# Patient Record
Sex: Female | Born: 1985 | Race: White | Hispanic: No | Smoking: Former smoker
Health system: Southern US, Community
[De-identification: ages and names within clinical notes are randomized; demographics above are authoritative.]

## PROBLEM LIST (undated history)

## (undated) DIAGNOSIS — E785 Hyperlipidemia, unspecified: Secondary | ICD-10-CM

## (undated) HISTORY — DX: Hyperlipidemia, unspecified: E78.5

---

## 2004-09-29 ENCOUNTER — Emergency Department (HOSPITAL_COMMUNITY): Admission: EM | Admit: 2004-09-29 | Discharge: 2004-09-29 | Payer: Self-pay | Admitting: Emergency Medicine

## 2004-12-24 ENCOUNTER — Emergency Department (HOSPITAL_COMMUNITY): Admission: EM | Admit: 2004-12-24 | Discharge: 2004-12-24 | Payer: Self-pay | Admitting: *Deleted

## 2005-01-05 ENCOUNTER — Ambulatory Visit (HOSPITAL_COMMUNITY): Admission: RE | Admit: 2005-01-05 | Discharge: 2005-01-05 | Payer: Self-pay | Admitting: *Deleted

## 2005-05-23 ENCOUNTER — Ambulatory Visit (HOSPITAL_COMMUNITY): Admission: RE | Admit: 2005-05-23 | Discharge: 2005-05-23 | Payer: Self-pay | Admitting: *Deleted

## 2005-06-05 ENCOUNTER — Ambulatory Visit (HOSPITAL_COMMUNITY): Admission: AD | Admit: 2005-06-05 | Discharge: 2005-06-05 | Payer: Self-pay | Admitting: *Deleted

## 2005-06-07 ENCOUNTER — Inpatient Hospital Stay (HOSPITAL_COMMUNITY): Admission: AD | Admit: 2005-06-07 | Discharge: 2005-06-09 | Payer: Self-pay | Admitting: *Deleted

## 2006-01-27 ENCOUNTER — Emergency Department (HOSPITAL_COMMUNITY): Admission: EM | Admit: 2006-01-27 | Discharge: 2006-01-27 | Payer: Self-pay | Admitting: Emergency Medicine

## 2006-01-28 ENCOUNTER — Emergency Department (HOSPITAL_COMMUNITY): Admission: EM | Admit: 2006-01-28 | Discharge: 2006-01-28 | Payer: Self-pay | Admitting: Emergency Medicine

## 2006-05-30 ENCOUNTER — Emergency Department (HOSPITAL_COMMUNITY): Admission: EM | Admit: 2006-05-30 | Discharge: 2006-05-30 | Payer: Self-pay | Admitting: Emergency Medicine

## 2006-12-09 ENCOUNTER — Emergency Department (HOSPITAL_COMMUNITY): Admission: EM | Admit: 2006-12-09 | Discharge: 2006-12-09 | Payer: Self-pay | Admitting: Family Medicine

## 2007-04-15 ENCOUNTER — Encounter: Admission: RE | Admit: 2007-04-15 | Discharge: 2007-04-15 | Payer: Self-pay | Admitting: *Deleted

## 2007-07-05 ENCOUNTER — Ambulatory Visit (HOSPITAL_COMMUNITY): Admission: RE | Admit: 2007-07-05 | Discharge: 2007-07-05 | Payer: Self-pay | Admitting: Obstetrics and Gynecology

## 2007-07-30 ENCOUNTER — Ambulatory Visit (HOSPITAL_COMMUNITY): Admission: RE | Admit: 2007-07-30 | Discharge: 2007-07-30 | Payer: Self-pay | Admitting: Obstetrics and Gynecology

## 2007-08-22 ENCOUNTER — Encounter: Admission: RE | Admit: 2007-08-22 | Discharge: 2007-08-29 | Payer: Self-pay | Admitting: *Deleted

## 2007-10-29 ENCOUNTER — Inpatient Hospital Stay (HOSPITAL_COMMUNITY): Admission: AD | Admit: 2007-10-29 | Discharge: 2007-10-29 | Payer: Self-pay | Admitting: Gynecology

## 2007-11-01 ENCOUNTER — Inpatient Hospital Stay (HOSPITAL_COMMUNITY): Admission: AD | Admit: 2007-11-01 | Discharge: 2007-11-01 | Payer: Self-pay | Admitting: Obstetrics and Gynecology

## 2007-12-25 ENCOUNTER — Inpatient Hospital Stay (HOSPITAL_COMMUNITY): Admission: AD | Admit: 2007-12-25 | Discharge: 2007-12-27 | Payer: Self-pay | Admitting: Obstetrics and Gynecology

## 2008-08-11 ENCOUNTER — Emergency Department (HOSPITAL_COMMUNITY): Admission: EM | Admit: 2008-08-11 | Discharge: 2008-08-11 | Payer: Self-pay | Admitting: Emergency Medicine

## 2008-08-18 ENCOUNTER — Inpatient Hospital Stay (HOSPITAL_COMMUNITY): Admission: EM | Admit: 2008-08-18 | Discharge: 2008-08-21 | Payer: Self-pay | Admitting: Emergency Medicine

## 2008-12-25 ENCOUNTER — Emergency Department (HOSPITAL_COMMUNITY): Admission: EM | Admit: 2008-12-25 | Discharge: 2008-12-25 | Payer: Self-pay | Admitting: Emergency Medicine

## 2008-12-29 ENCOUNTER — Emergency Department (HOSPITAL_COMMUNITY): Admission: EM | Admit: 2008-12-29 | Discharge: 2008-12-29 | Payer: Self-pay | Admitting: Emergency Medicine

## 2009-02-16 ENCOUNTER — Emergency Department (HOSPITAL_COMMUNITY): Admission: EM | Admit: 2009-02-16 | Discharge: 2009-02-16 | Payer: Self-pay | Admitting: Emergency Medicine

## 2009-02-23 ENCOUNTER — Emergency Department (HOSPITAL_COMMUNITY): Admission: EM | Admit: 2009-02-23 | Discharge: 2009-02-23 | Payer: Self-pay | Admitting: Emergency Medicine

## 2009-03-08 ENCOUNTER — Emergency Department (HOSPITAL_COMMUNITY): Admission: EM | Admit: 2009-03-08 | Discharge: 2009-03-08 | Payer: Self-pay | Admitting: Emergency Medicine

## 2009-03-10 ENCOUNTER — Emergency Department (HOSPITAL_COMMUNITY): Admission: EM | Admit: 2009-03-10 | Discharge: 2009-03-10 | Payer: Self-pay | Admitting: Emergency Medicine

## 2009-06-02 ENCOUNTER — Emergency Department (HOSPITAL_COMMUNITY): Admission: EM | Admit: 2009-06-02 | Discharge: 2009-06-02 | Payer: Self-pay | Admitting: Emergency Medicine

## 2010-12-25 ENCOUNTER — Encounter: Payer: Self-pay | Admitting: *Deleted

## 2011-03-20 LAB — HEPATIC FUNCTION PANEL
ALT: 143 U/L — ABNORMAL HIGH (ref 0–35)
AST: 89 U/L — ABNORMAL HIGH (ref 0–37)
Albumin: 3.7 g/dL (ref 3.5–5.2)
Alkaline Phosphatase: 157 U/L — ABNORMAL HIGH (ref 39–117)
Bilirubin, Direct: 0.2 mg/dL (ref 0.0–0.3)
Indirect Bilirubin: 0.1 mg/dL — ABNORMAL LOW (ref 0.3–0.9)
Total Bilirubin: 0.3 mg/dL (ref 0.3–1.2)
Total Protein: 7.1 g/dL (ref 6.0–8.3)

## 2011-03-20 LAB — DIFFERENTIAL
Basophils Absolute: 0 10*3/uL (ref 0.0–0.1)
Basophils Relative: 1 % (ref 0–1)
Eosinophils Absolute: 0.1 10*3/uL (ref 0.0–0.7)
Eosinophils Relative: 3 % (ref 0–5)
Lymphocytes Relative: 39 % (ref 12–46)
Lymphs Abs: 1.6 10*3/uL (ref 0.7–4.0)
Monocytes Absolute: 0.5 10*3/uL (ref 0.1–1.0)
Monocytes Relative: 12 % (ref 3–12)
Neutro Abs: 1.9 10*3/uL (ref 1.7–7.7)
Neutrophils Relative %: 45 % (ref 43–77)

## 2011-03-20 LAB — CBC
HCT: 43 % (ref 36.0–46.0)
Hemoglobin: 14.3 g/dL (ref 12.0–15.0)
MCHC: 33.2 g/dL (ref 30.0–36.0)
MCV: 85 fL (ref 78.0–100.0)
Platelets: 287 10*3/uL (ref 150–400)
RBC: 5.06 MIL/uL (ref 3.87–5.11)
RDW: 13.1 % (ref 11.5–15.5)
WBC: 4.2 10*3/uL (ref 4.0–10.5)

## 2011-03-20 LAB — URINALYSIS, ROUTINE W REFLEX MICROSCOPIC
Bilirubin Urine: NEGATIVE
Bilirubin Urine: NEGATIVE
Glucose, UA: NEGATIVE mg/dL
Glucose, UA: NEGATIVE mg/dL
Hgb urine dipstick: NEGATIVE
Ketones, ur: NEGATIVE mg/dL
Ketones, ur: 15 mg/dL — AB
Nitrite: NEGATIVE
Nitrite: NEGATIVE
Protein, ur: NEGATIVE mg/dL
Specific Gravity, Urine: 1.015 (ref 1.005–1.030)
Specific Gravity, Urine: 1.028 (ref 1.005–1.030)
Urobilinogen, UA: 0.2 mg/dL (ref 0.0–1.0)
Urobilinogen, UA: 1 mg/dL (ref 0.0–1.0)
pH: 8.5 — ABNORMAL HIGH (ref 5.0–8.0)
pH: 6.5 (ref 5.0–8.0)

## 2011-03-20 LAB — BASIC METABOLIC PANEL
BUN: 10 mg/dL (ref 6–23)
CO2: 25 mEq/L (ref 19–32)
Calcium: 9.4 mg/dL (ref 8.4–10.5)
Chloride: 107 mEq/L (ref 96–112)
Creatinine, Ser: 0.72 mg/dL (ref 0.4–1.2)
GFR calc Af Amer: >60 mL/min (ref >60)
GFR calc non Af Amer: >60 mL/min (ref >60)
Glucose, Bld: 99 mg/dL (ref 70–99)
Potassium: 3.9 mEq/L (ref 3.5–5.1)
Sodium: 140 mEq/L (ref 135–145)

## 2011-03-20 LAB — LIPASE, BLOOD: Lipase: 41 U/L (ref 11–59)

## 2011-03-20 LAB — GC/CHLAMYDIA PROBE AMP, GENITAL
Chlamydia, DNA Probe: NEGATIVE
GC Probe Amp, Genital: NEGATIVE

## 2011-03-20 LAB — WET PREP, GENITAL
Clue Cells Wet Prep HPF POC: NONE SEEN
Trich, Wet Prep: NONE SEEN
WBC, Wet Prep HPF POC: NONE SEEN
Yeast Wet Prep HPF POC: NONE SEEN

## 2011-03-20 LAB — URINE MICROSCOPIC-ADD ON

## 2011-03-20 LAB — POCT PREGNANCY, URINE: Preg Test, Ur: NEGATIVE

## 2011-03-20 LAB — PREGNANCY, URINE: Preg Test, Ur: NEGATIVE

## 2011-04-18 NOTE — Discharge Summary (Signed)
NAMESHELVA, [name]                ACCOUNT NO.:  [id number]   MEDICAL RECORD NO.:  [id number]          PATIENT TYPE:  INP   LOCATION:  5120                         FACILITY:  [facility]   PHYSICIAN:  Re[name], M.D.   DATE OF BIRTH:  1986-10-26   DATE OF ADMISSION:  08/17/2008  DATE OF DISCHARGE:  08/21/2008                               DISCHARGE SUMMARY   PRIMARY CARE [name]:  Rebekah Feeler, PA, [facility], [name] at  [name].   DISCHARGE DIAGNOSES:  1. Escherichia coli pyelonephritis.  2. Bacterial vaginosis.  3. Mild right hydroureter in the setting of a history of      nephrolithiasis.  4. Hyperbilirubinemia, resolved.  5. Normocytic anemia.  6. Mild thrombocytopenia.  7. Moderate protein-calorie malnutrition.  8. Chest pain.   DISCHARGE MEDICATIONS:  1. Cipro 500 mg b.i.d. x11 days.  2. Ibuprofen 600 mg q.6 h. p.r.n. pain.   CONSULTATIONS:  None.   BRIEF ADMISSION HISTORY OF PRESENT ILLNESS:  The patient is a 25-year-  old female who presented to the [name] with chief complaint of nausea,  vomiting, and back pain.  She apparently was seen in [name] in the  emergency department there several days prior to this and was discharged  from the emergency department, but continued to have symptoms and  represented to the [name] on August 17, 2008.  At that time,  she was found to have abnormal urinalysis and was admitted for further  evaluation and treatment.  For the full details, please see the dictated  report done by Dr. Oralia [name].   PROCEDURES AND DIAGNOSTIC STUDIES:  1. CT scan of the abdomen and pelvis on August 18, 2008, showed      mild right-sided hydroureter with no ureteral or bladder stones      identified.  The colon and small bowel were unremarkable.  There      was no free fluid or abdominal fluid collections.  There was no      significant lymphadenopathy.  The bladder was normal.  Visualized      lung bases were  clear.  Kidneys were normal with no hydronephrosis      or nephrolithiasis.  2. KUB on August 19, 2008, showed normal bowel gas pattern.  3. CT angiogram of the chest done August 20, 2008, showed bilateral      small pleural effusions with associated compressive atelectasis and      no evidence for pulmonary embolism up to and including the third      order pulmonary arteries.   DISCHARGE LABORATORY VALUES:  Chemistries were completely normal with  the exception of a total protein of 4.7, albumin of 2.2, and calcium of  7.8.  CBC was normal with the exception of a hemoglobin of 9.6,  hematocrit of 28.2, platelet count of 141, and white blood cell count  was 6.4.   [name] COURSE BY PROBLEM:  1. Escherichia coli pyelonephritis with abdominal pain, nausea, and      vomiting as well as back pain:  The patient was admitted and  empirically put on IV Cipro.  On [name] day #1, this was      broadened to include IV Rocephin pending sensitivity data.  Blood      and urine cultures were obtained and urine culture subsequently      grew out Escherichia coli that was sensitive to Cipro.  The patient      was put back on Cipro by the oral route.  Fever curve improved with      treatment.  She never developed any significant leukocytosis and at      this time, the patient is reporting that her back pain has resolved      and she no longer has any nausea or vomiting.  She will complete an      additional 10 days of therapy with Cipro for full 2-week course of      treatment.  2. Bacterial vaginosis:  Course of her workup, the patient did receive      a wet prep and vaginal exam in the emergency department.  Wet prep      showed many white blood cells with a few clue cells.  There were no      trichomonads and moderate yeast.  At this time, the patient is not      complaining of vaginal symptoms and therefore is not treated.  GC      and chlamydia probes were also checked and found to  be negative.      If she develops signs of vaginitis or irritation, empiric treatment      with Flagyl would be recommended.  Additionally, if she develops      worsening abdominal pain or nonresolution of symptoms, a workup for      pelvic inflammatory disease can be considered.  3. Mild right hydroureter/history of nephrolithiasis:  Is likely the      patient did pass a stone and has some residual right hydroureter      and pain in the right lower quadrant.  The patient has been      counseled that this should improve over time, but to return to the      emergency department for a fever or nonresolution of symptoms.  4. Hyperbilirubinemia:  The patient's bilirubin was elevated on      admission, but quickly reverted to normal and is normal at      discharge.  5. Moderate protein depletion malnutrition:  Suspect the patient      restricts her intake.  She did ask for an enema, which is an      unusual request on a 25 year old female.  Her primary care [name]      should consider evaluating her for the possibility of an underlying      eating disorder.  6. Normocytic anemia:  The patient's anemia is likely due to menstrual      losses.  No further diagnostic workup was undertaken.  7. Mild thrombocytopenia:  The patient's thrombocytopenia is very      mild.  She should have a followup CBC done at her primary care      [name] office when her symptoms are resolved.  8. Chest pain.  The patient did develop chest pain on August 20, 2008, described as a pressure in the substernal area like a cinder      block on her chest.  A D-dimer was elevated and subsequently the      patient underwent  CT angiogram screening, which did not reveal any      evidence of pulmonary embolism or source of her symptoms.  The      patient was reassured that her chest pain was non-life-threatening      and should get better with time.   DISPOSITION:  The patient is medically stable and would be  discharged  home.  She is instructed to call her primary care [name] office and  arrange for a [name] followup appointment next week.  The patient was  also advised to use an alternative form of birth control therapy for the  next 1 month given that she has been on antibiotic therapy.      Rebekah [name], M.D.  Electronically Signed     CR/MEDQ  D:  08/21/2008  T:  08/21/2008  Job:  [id number]   cc:   Rebekah [name], P.A.

## 2011-04-18 NOTE — H&P (Signed)
[name], [name]                ACCOUNT NO.:  [id number]   MEDICAL RECORD NO.:  [id number]          PATIENT TYPE:  INP   LOCATION:  5120                         FACILITY:  [facility]   PHYSICIAN:  Darryl D. Prime, MD    DATE OF BIRTH:  [birth year]   DATE OF ADMISSION:  08/17/2008  DATE OF DISCHARGE:                              HISTORY & PHYSICAL   PRIMARY CARE PHYSICIAN:  Prudy Feeler in [place], [facility].   The patient is full code.   The patient will be historian as well as her father. They were good  historians.   The patient's total visit time was approximately 48 minutes.   CHIEF COMPLAINT:  Nausea, vomiting, back pain.   HISTORY OF PRESENT ILLNESS:  Ms. Rebekah [name] is a 25 year old female with a  history of kidney stones who notes over the last week nausea and  vomiting. She was seen in [place] ER on last Tuesday, and urinalysis  was alright at that time and was discharged from the emergency room. She  had right back pain, and she continued to have nausea, vomiting, and  severe pain over the last 2 days in the right back/right flank area. In  the emergency room, she was found to have an abnormal urinalysis  consistent with a urinary tract infection. She had a CT of the abdomen  and pelvis noncontrast, which showed a mild right hydroureter and no  obstructive calculi or bladder stone. The patient was given  ciprofloxacin IV in the emergency room, Reglan, morphine, Zofran and  Dilaudid.   PAST MEDICAL HISTORY AND PAST SURGICAL HISTORY:  Has a history of kidney  stones 2 years ago.   MEDICATIONS:  She took her husband's Vicodin. Otherwise, she is on no  chronic medicines.   ALLERGIES:  No known drug allergies.   SOCIAL HISTORY:  History of tobacco abuse of a half a pack per day. No  alcohol or illicit drug use.   FAMILY HISTORY:  Positive for diabetes. It runs in her family on the  father's side.   REVIEW OF SYSTEMS:  A 14-point review of systems negative  unless stated  above.   PHYSICAL EXAMINATION:  VITAL SIGNS:  Temperature is 99.7, another  temperature was 100.8, blood pressure 127/78, pulse 88, respiratory rate  16, saturations 98% on room air.  GENERAL:  The patient is a female who looks her stated age, drowsy, in  mild pain, in no acute distress.  HEENT:  Normocephalic, atraumatic. Pupils are equal, round, and reactive  to light. Extraocular movements intact. The oropharynx is dry.  NECK:  Supple with no lymphadenopathy or thyromegaly. No carotid bruits.  No jugular venous distention.  LUNGS:  Clear to auscultation bilaterally.  CARDIOVASCULAR EXAM:  Regular rhythm and rate with no murmurs, rubs, or  gallops. Normal S1-S2. No S3 or S4.  ABDOMEN:  Shows tenderness in the right lower quadrant with no signs of  rebound. No hepatosplenomegaly. No signs of acute abdomen. She had mild  right CVA tenderness. The patient had a pelvic exam in the emergency  room that was  unremarkable.  EXTREMITIES:  Show no clubbing, cyanosis, or edema.   Wet prep shows some clue cells and some yeast; otherwise negative.  Cardiac markers at 1:45 were negative. Urinalysis shows white blood  cells too numerous to count, RBCs 11 to 20, bacteria many, blood large.  The patient had protein of greater than 300, nitrate positive, leukocyte  esterase large, ketones greater than 80, bili moderate. White count 10.8  with a hemoglobin of 14.4, hematocrit 42.4, platelets 204, segs of 90.  HCG urine for pregnancy was negative. Sodium 140 with a potassium of  4.4, chloride 104, bicarb 28, BUN 15, creatinine 1.3, glucose 108.  Baseline creatinine is 1. CT of the abdomen as above.   ASSESSMENT AND PLAN:  This is a patient with possible urinary tract  infection that may be related to kidney stone that has passed. Given her  hydroureter, the urinary tract infection is significant. She will be  admitted for intravenous fluids, and we will give her pain medications  and  antiemetics. The patient will get liver function tests to rule out  pancreatitis. This is less likely given her exam. Appendicitis is also  in the differential, although this is less likely given her hydroureter  here on CT. If she does not improve with antibiotics and intravenous  fluids, will consider a CT scan with contrast to rule out another intra-  abdominal process that may be the cause. We will consult concerning  discontinuation of tobacco.      Darryl D. Prime, MD  Electronically Signed     DDP/MEDQ  D:  08/18/2008  T:  08/18/2008  Job:  [id number]

## 2011-04-21 NOTE — Group Therapy Note (Signed)
[name], Re[name]                ACCOUNT NO.:  [id number]   MEDICAL RECORD NO.:  [id number]          PATIENT TYPE:  INP   LOCATION:  A401                          FACILITY:  [facility]   PHYSICIAN:  Langley Gauss, MD     DATE OF BIRTH:  07-Sep-1986   DATE OF PROCEDURE:  06/07/2005  DATE OF DISCHARGE:                                   PROGRESS NOTE   The patient had Foley bulb placed this morning. Continues to have a  reassuring fetal heart rate. She does complain of pelvic pressure and some  tightening pain. External fetal monitor does not reveal any significant  uterine activity. Fetal heart rate remains reassuring. With gentle traction,  I was able to remove the Foley catheter without deflating the bulb, after  which time the cervix is noted to be 3 cm dilated and 90% effaced with a  vertex at a 0 station and well applied to the cervix. A fetal scalp  electrode was placed with resultant amniotomy. No fluid is noted yet.   PLAN:  The patient is to be managed expectantly for two hours duration after  which time Pitocin augmentation or induction can be initiated as clinically  indicated. The patient in addition would like epidural placed during the  course of active labor.       DC/MEDQ  D:  06/07/2005  T:  06/07/2005  Job:  [id number]

## 2011-04-21 NOTE — Op Note (Signed)
NAMEMERIDITH, ROMICK                ACCOUNT NO.:  [id number]   MEDICAL RECORD NO.:  [id number]          PATIENT TYPE:  INP   LOCATION:  A401                          FACILITY:  [facility]   PHYSICIAN:  Langley Gauss, MD     DATE OF BIRTH:  February 08, 1986   DATE OF PROCEDURE:  06/08/2005  DATE OF DISCHARGE:                                 OPERATIVE REPORT   PROCEDURE:  Placement of continuous lumbar epidural analgesia.   SURGEON:  Langley Gauss, MD   COMPLICATIONS:  None.   DESCRIPTION OF PROCEDURE:  An appropriate informed consent was obtained.  The patient was placed in a seated position.  Bony landmarks were  identified.  The L3-L4 interspace was identified.  The patient's back is  sterilely prepped and draped utilizing the epidural tray, and 5 cc of 1%  lidocaine plain injected at the midline at the L3-L4 interspace to raise a  small skin wheal.  A 17 gauge Tuohy Schliff needle was then utilized with  loss resistance in air-filled glass syringe to atraumatically identify entry  into the epidural space on the first attempt without difficulty.  An  excellent loss of resistance was noted.  Initial test dose 5 cc 1.5%  lidocaine plus epinephrine injected through the epidural needle.  No signs  of CSF or intravascular injection obtained.  The epidural catheter was then  inserted to a depth of 5 cm.  The epidural needle was removed.  Aspiration  test is negative.  A second test dose 2 cc 1.5% lidocaine plus epinephrine  injected through the epidural catheter.  Again no signs of CSF or  intravascular injection obtained.  The catheter was thus secured into place.  The patient is returned supine position at which time she is having evidence  of a adequate bilateral block which is setting up very rapidly.  The patient  was thus connected the infusion pump with a standard mixture.  She was  treated with the continuous infusion rate at 14 cc/hour.  The patient  examined at that time and reveals  cervix to be 6 cm dilated, completely  effaced with the vertex at a 0 station.  She continues to have reassuring  fetal heart rate.  Clear amniotic fluid was noted.  Uterine contractions q.  4 minutes.       DC/MEDQ  D:  06/08/2005  T:  06/08/2005  Job:  [id number]

## 2011-08-24 LAB — CBC
HCT: 31.8 — ABNORMAL LOW
HCT: 40.2
Hemoglobin: 11 — ABNORMAL LOW
Hemoglobin: 13.9
MCHC: 34.5
MCHC: 34.6
MCV: 92.2
MCV: 93.4
Platelets: 159
Platelets: 219
RBC: 3.41 — ABNORMAL LOW
RBC: 4.36
RDW: 13.3
RDW: 13.6
WBC: 10.1
WBC: 16.7 — ABNORMAL HIGH

## 2011-08-24 LAB — RPR: RPR Ser Ql: NONREACTIVE

## 2011-09-04 LAB — CBC
HCT: 28.2 — ABNORMAL LOW
HCT: 30.5 — ABNORMAL LOW
HCT: 42.4
Hemoglobin: 10.3 — ABNORMAL LOW
Hemoglobin: 14.4
Hemoglobin: 9.6 — ABNORMAL LOW
MCHC: 33.9
MCHC: 34
MCHC: 34
MCV: 88.7
MCV: 89.2
MCV: 89.3
Platelets: 107 — ABNORMAL LOW
Platelets: 141 — ABNORMAL LOW
Platelets: 204
RBC: 3.16 — ABNORMAL LOW
RBC: 3.42 — ABNORMAL LOW
RBC: 4.79
RDW: 12.2
RDW: 12.4
RDW: 12.6
WBC: 10.8 — ABNORMAL HIGH
WBC: 6.4
WBC: 7.2

## 2011-09-04 LAB — URINE CULTURE: Colony Count: >=100000

## 2011-09-04 LAB — POCT I-STAT, CHEM 8
BUN: 15
Calcium, Ion: 1.12
Chloride: 104
Creatinine, Ser: 1.3 — ABNORMAL HIGH
Glucose, Bld: 108 — ABNORMAL HIGH
HCT: 44
Hemoglobin: 15
Potassium: 4.4
Sodium: 140
TCO2: 28

## 2011-09-04 LAB — COMPREHENSIVE METABOLIC PANEL
ALT: 31
ALT: 54 — ABNORMAL HIGH
AST: 21
AST: 36
Albumin: 2.2 — ABNORMAL LOW
Albumin: 3.2 — ABNORMAL LOW
Alkaline Phosphatase: 76
Alkaline Phosphatase: 82
BUN: 10
BUN: 6
CO2: 22
CO2: 25
Calcium: 7.8 — ABNORMAL LOW
Calcium: 8.5
Chloride: 105
Chloride: 109
Creatinine, Ser: 0.63
Creatinine, Ser: 0.96
GFR calc Af Amer: >60
GFR calc Af Amer: >60
GFR calc non Af Amer: >60
GFR calc non Af Amer: >60
Glucose, Bld: 101 — ABNORMAL HIGH
Glucose, Bld: 98
Potassium: 3.7
Potassium: 4.1
Sodium: 138
Sodium: 141
Total Bilirubin: 0.4
Total Bilirubin: 2.4 — ABNORMAL HIGH
Total Protein: 4.7 — ABNORMAL LOW
Total Protein: 6.3

## 2011-09-04 LAB — DIFFERENTIAL
Basophils Absolute: 0
Basophils Relative: 0
Eosinophils Absolute: 0
Eosinophils Relative: 0
Lymphocytes Relative: 6 — ABNORMAL LOW
Lymphs Abs: 0.7
Monocytes Absolute: 0.4
Monocytes Relative: 3
Neutro Abs: 9.7 — ABNORMAL HIGH
Neutrophils Relative %: 90 — ABNORMAL HIGH

## 2011-09-04 LAB — URINE MICROSCOPIC-ADD ON

## 2011-09-04 LAB — URINALYSIS, ROUTINE W REFLEX MICROSCOPIC
Glucose, UA: NEGATIVE
Ketones, ur: >80 — AB
Nitrite: POSITIVE — AB
Protein, ur: >300 — AB
Specific Gravity, Urine: 1.022
Urobilinogen, UA: 2 — ABNORMAL HIGH
pH: 6.5

## 2011-09-04 LAB — BASIC METABOLIC PANEL
BUN: 5 — ABNORMAL LOW
CO2: 24
Calcium: 8.2 — ABNORMAL LOW
Chloride: 112
Creatinine, Ser: 0.77
GFR calc Af Amer: >60
GFR calc non Af Amer: >60
Glucose, Bld: 98
Potassium: 4
Sodium: 139

## 2011-09-04 LAB — HEPATIC FUNCTION PANEL
ALT: 34
AST: 18
Albumin: 2.4 — ABNORMAL LOW
Alkaline Phosphatase: 62
Bilirubin, Direct: 0.6 — ABNORMAL HIGH
Indirect Bilirubin: 0.5
Total Bilirubin: 1.1
Total Protein: 5.2 — ABNORMAL LOW

## 2011-09-04 LAB — CK TOTAL AND CKMB (NOT AT ARMC)
CK, MB: 0.3
Relative Index: INVALID
Total CK: 30

## 2011-09-04 LAB — RAPID URINE DRUG SCREEN, HOSP PERFORMED
Amphetamines: NOT DETECTED
Barbiturates: NOT DETECTED
Benzodiazepines: POSITIVE — AB
Cocaine: NOT DETECTED
Opiates: POSITIVE — AB
Tetrahydrocannabinol: NOT DETECTED

## 2011-09-04 LAB — WET PREP, GENITAL: Trich, Wet Prep: NONE SEEN

## 2011-09-04 LAB — GC/CHLAMYDIA PROBE AMP, GENITAL
Chlamydia, DNA Probe: NEGATIVE
GC Probe Amp, Genital: NEGATIVE

## 2011-09-04 LAB — PREGNANCY, URINE: Preg Test, Ur: NEGATIVE

## 2011-09-04 LAB — CULTURE, BLOOD (ROUTINE X 2)
Culture: NO GROWTH
Culture: NO GROWTH
Culture: NO GROWTH
Culture: NO GROWTH

## 2011-09-04 LAB — D-DIMER, QUANTITATIVE: D-Dimer, Quant: 2.08 — ABNORMAL HIGH

## 2011-09-04 LAB — TROPONIN I: Troponin I: 0.01

## 2011-09-04 LAB — HAPTOGLOBIN: Haptoglobin: 155

## 2011-09-06 LAB — URINALYSIS, ROUTINE W REFLEX MICROSCOPIC
Glucose, UA: NEGATIVE
Hgb urine dipstick: NEGATIVE
Ketones, ur: 15 — AB
Leukocytes, UA: NEGATIVE
Nitrite: NEGATIVE
Protein, ur: 30 — AB
Specific Gravity, Urine: 1.02
Urobilinogen, UA: 2 — ABNORMAL HIGH
pH: 7

## 2011-09-06 LAB — COMPREHENSIVE METABOLIC PANEL
ALT: 37 — ABNORMAL HIGH
AST: 22
Albumin: 4
Alkaline Phosphatase: 73
BUN: 11
CO2: 28
Calcium: 9.4
Chloride: 109
Creatinine, Ser: 1
GFR calc Af Amer: >60
GFR calc non Af Amer: >60
Glucose, Bld: 90
Potassium: 3.6
Sodium: 143
Total Bilirubin: 0.6
Total Protein: 6.8

## 2011-09-06 LAB — LIPASE, BLOOD: Lipase: 32

## 2011-09-06 LAB — DIFFERENTIAL
Basophils Absolute: 0
Basophils Relative: 1
Eosinophils Absolute: 0.1
Eosinophils Relative: 3
Lymphocytes Relative: 32
Lymphs Abs: 1.1
Monocytes Absolute: 0.4
Monocytes Relative: 11
Neutro Abs: 1.9
Neutrophils Relative %: 53

## 2011-09-06 LAB — URINE MICROSCOPIC-ADD ON

## 2011-09-06 LAB — PREGNANCY, URINE: Preg Test, Ur: NEGATIVE

## 2011-09-06 LAB — CBC
HCT: 40.1
Hemoglobin: 13.9
MCHC: 34.5
MCV: 88.9
Platelets: 173
RBC: 4.51
RDW: 12.3
WBC: 3.6 — ABNORMAL LOW

## 2011-09-12 LAB — COMPREHENSIVE METABOLIC PANEL
ALT: 16
AST: 16
Albumin: 2.5 — ABNORMAL LOW
Alkaline Phosphatase: 98
BUN: 8
CO2: 23
Calcium: 8.2 — ABNORMAL LOW
Chloride: 104
Creatinine, Ser: 0.79
GFR calc Af Amer: >60
GFR calc non Af Amer: >60
Glucose, Bld: 92
Potassium: 3.8
Sodium: 138
Total Bilirubin: 0.8
Total Protein: 6.2

## 2011-09-12 LAB — CBC
HCT: 34.2 — ABNORMAL LOW
HCT: 34.7 — ABNORMAL LOW
Hemoglobin: 11.9 — ABNORMAL LOW
Hemoglobin: 12.1
MCHC: 34.4
MCHC: 35.4
MCV: 91.9
MCV: 92.5
Platelets: 180
Platelets: 205
RBC: 3.73 — ABNORMAL LOW
RBC: 3.75 — ABNORMAL LOW
RDW: 13
RDW: 13
WBC: 11.3 — ABNORMAL HIGH
WBC: 8.8

## 2011-09-12 LAB — URINALYSIS, ROUTINE W REFLEX MICROSCOPIC
Glucose, UA: 100 — AB
Glucose, UA: NEGATIVE
Ketones, ur: >80 — AB
Ketones, ur: >80 — AB
Leukocytes, UA: NEGATIVE
Nitrite: NEGATIVE
Nitrite: NEGATIVE
Protein, ur: 100 — AB
Protein, ur: 100 — AB
Specific Gravity, Urine: >1.03 — ABNORMAL HIGH
Specific Gravity, Urine: >1.03 — ABNORMAL HIGH
Urobilinogen, UA: 0.2
Urobilinogen, UA: 0.2
pH: 6
pH: 6

## 2011-09-12 LAB — DIFFERENTIAL
Basophils Absolute: 0
Basophils Relative: 0
Eosinophils Absolute: 0 — ABNORMAL LOW
Eosinophils Relative: 0
Lymphocytes Relative: 8 — ABNORMAL LOW
Lymphs Abs: 0.7
Monocytes Absolute: 0.8
Monocytes Relative: 9
Neutro Abs: 7.2
Neutrophils Relative %: 83 — ABNORMAL HIGH

## 2011-09-12 LAB — URINE MICROSCOPIC-ADD ON

## 2013-02-24 ENCOUNTER — Ambulatory Visit (INDEPENDENT_AMBULATORY_CARE_PROVIDER_SITE_OTHER): Payer: Medicaid Other | Admitting: Nurse Practitioner

## 2013-02-24 ENCOUNTER — Encounter: Payer: Self-pay | Admitting: Nurse Practitioner

## 2013-02-24 VITALS — BP 130/72 | HR 79 | Temp 99.2°F | Ht 64.0 in | Wt 145.0 lb

## 2013-02-24 DIAGNOSIS — Z0000 Encounter for general adult medical examination without abnormal findings: Secondary | ICD-10-CM

## 2013-02-24 DIAGNOSIS — Z01419 Encounter for gynecological examination (general) (routine) without abnormal findings: Secondary | ICD-10-CM

## 2013-02-24 DIAGNOSIS — M549 Dorsalgia, unspecified: Secondary | ICD-10-CM

## 2013-02-24 LAB — POCT URINALYSIS DIPSTICK
Bilirubin, UA: NEGATIVE
Glucose, UA: NEGATIVE
Ketones, UA: NEGATIVE
Nitrite, UA: NEGATIVE
Protein, UA: NEGATIVE
Spec Grav, UA: >=1.03
Urobilinogen, UA: NEGATIVE
pH, UA: 5

## 2013-02-24 LAB — POCT HEMOGLOBIN: Hemoglobin: 11.8 g/dL — AB (ref 12.2–16.2)

## 2013-02-24 LAB — POCT UA - MICROSCOPIC ONLY
Bacteria, U Microscopic: NEGATIVE
Casts, Ur, LPF, POC: NEGATIVE
Crystals, Ur, HPF, POC: NEGATIVE
Mucus, UA: NEGATIVE
Yeast, UA: NEGATIVE

## 2013-02-24 MED ORDER — NORGESTIM-ETH ESTRAD TRIPHASIC 0.18/0.215/0.25 MG-25 MCG PO TABS: 1.0000 | ORAL_TABLET | Freq: Every day | ORAL | Status: DC

## 2013-02-24 MED ORDER — CYCLOBENZAPRINE HCL 5 MG PO TABS: 5.0000 mg | ORAL_TABLET | Freq: Three times a day (TID) | ORAL | Status: DC | PRN

## 2013-02-24 NOTE — Patient Instructions (Addendum)
Safer Sex Your caregiver wants you to have this information about the infections that can be transmitted from sexual contact and how to prevent them. The idea behind safer sex is that you can be sexually active, and at the same time reduce the risk of giving or getting a sexually transmitted disease (STD). Every person should be aware of how to prevent him or herself and his or her sex partner from getting an STD. CAUSES OF STDS STDs are transmitted by sharing body fluids, which contain viruses and bacteria. The following fluids all transmit infections during sexual intercourse and sex acts:  Semen.  Saliva.  Urine.  Blood.  Vaginal mucus. Examples of STDs include:  Chlamydia.  Gonorrhea.  Genital herpes.  Hepatitis B.  Human immunodeficiency virus or acquired immunodeficiency syndrome (HIV or AIDS).  Syphilis.  Trichomonas.  Pubic lice.  Human papillomavirus (HPV), which may include:  Genital warts.  Cervical dysplasia.  Cervical cancer (can develop with certain types of HPV). SYMPTOMS  Sexual diseases often cause few or no symptoms until they are advanced, so a person can be infected and spread the infection without knowing it. Some STDs respond to treatment very well. Others, like HIV and herpes, cannot be cured, but are treated to reduce their effects. Specific symptoms include:  Abnormal vaginal discharge.  Irritation or itching in and around the vagina, and in the pubic hair.  Pain during sexual intercourse.  Bleeding during sexual intercourse.  Pelvic or abdominal pain.  Fever.  Growths in and around the vagina.  An ulcer in or around the vagina.  Swollen glands in the groin area. DIAGNOSIS   Blood tests.  Pap test.  Culture test of abnormal vaginal discharge.  A test that applies a solution and examines the cervix with a lighted magnifying scope (colposcopy).  A test that examines the pelvis with a lighted tube, through a small incision  (laparoscopy). TREATMENT  The treatment will depend on the cause of the STD.  Antibiotic treatment by injection, oral, creams, or suppositories in the vagina.  Over-the-counter medicated shampoo, to get rid of pubic lice.  Removing or treating growths with medicine, freezing, burning (electrocautery), or surgery.  Surgery treatment for HPV of the cervix.  Supportive medicines for herpes, HIV, AIDS, and hepatitis. Being careful cannot eliminate all risk of infection, but sex can be made much safer. Safe sexual practices include body massage and gentle touching. Masturbation is safe, as long as body fluids do not contact skin that has sores or cuts. Dry kissing and oral sex on a man wearing a latex condom or on a woman wearing a female condom is also safe. Slightly less safe is intercourse while the man wears a latex condom or wet kissing. It is also safer to have one sex partner that you know is not having sex with anyone else. LENGTH OF ILLNESS An STD might be treated and cured in a week, sometimes a month, or more. And it can linger with symptoms for many years. STDs can also cause damage to the female organs. This can cause chronic pain, infertility, and recurrence of the STD, especially herpes, hepatitis, HIV, and HPV. HOME CARE INSTRUCTIONS AND PREVENTION  Alcohol and recreational drugs are often the reason given for not practicing safer sex. These substances affect your judgment. Alcohol and recreational drugs can also impair your immune system, making you more vulnerable to disease.  Do not engage in risky and dangerous sexual practices, including:  Vaginal or anal sex without a  condom.  Oral sex on a man without a condom.  Oral sex on a woman without a female condom.  Using saliva to lubricate a condom.  Any other sexual contact in which body fluids or blood from one partner contact the other partner.  You should use only latex condoms for men and water soluble lubricants.  Petroleum based lubricants or oils used to lubricate a condom will weaken the condom and increase the chance that it will break.  Think very carefully before having sex with anyone who is high risk for STDs and HIV. This includes IV drug users, people with multiple sexual partners, or people who have had an STD, or a positive hepatitis or HIV blood test.  Remember that even if your partner has had only one previous partner, their previous partner might have had multiple partners. If so, you are at high risk of being exposed to an STD. You and your sex partner should be the only sex partners with each other, with no one else involved.  A vaccine is available for hepatitis B and HPV through your caregiver or the Public Health Department. Everyone should be vaccinated with these vaccines.  Avoid risky sex practices. Sex acts that can break the skin make you more likely to get an STD. SEEK MEDICAL CARE IF:   If you think you have an STD, even if you do not have any symptoms. Contact your caregiver for evaluation and treatment, if needed.  You think or know your sex partner has acquired an STD.  You have any of the symptoms mentioned above. Document Released: 12/28/2004 Document Revised: 02/12/2012 Document Reviewed: 10/20/2009 [employer] Patient Information 2013 [place], [place]. Smoking Cessation Quitting smoking is important to your health and has many advantages. However, it is not always easy to quit since nicotine is a very addictive drug. Often times, people try 3 times or more before being able to quit. This document explains the best ways for you to prepare to quit smoking. Quitting takes hard work and a lot of effort, but you can do it. ADVANTAGES OF QUITTING SMOKING  You will live longer, feel better, and live better.  Your body will feel the impact of quitting smoking almost immediately.  Within 20 minutes, blood pressure decreases. Your pulse returns to its normal level.  After 8  hours, carbon monoxide levels in the blood return to normal. Your oxygen level increases.  After 24 hours, the chance of having a heart attack starts to decrease. Your breath, hair, and body stop smelling like smoke.  After 48 hours, damaged nerve endings begin to recover. Your sense of taste and smell improve.  After 72 hours, the body is virtually free of nicotine. Your bronchial tubes relax and breathing becomes easier.  After 2 to 12 weeks, lungs can hold more air. Exercise becomes easier and circulation improves.  The risk of having a heart attack, stroke, cancer, or lung disease is greatly reduced.  After 1 year, the risk of coronary heart disease is cut in half.  After 5 years, the risk of stroke falls to the same as a nonsmoker.  After 10 years, the risk of lung cancer is cut in half and the risk of other cancers decreases significantly.  After 15 years, the risk of coronary heart disease drops, usually to the level of a nonsmoker.  If you are pregnant, quitting smoking will improve your chances of having a healthy baby.  The people you live with, especially any children, will be  healthier.  You will have extra money to spend on things other than cigarettes. QUESTIONS TO THINK ABOUT BEFORE ATTEMPTING TO QUIT You may want to talk about your answers with your caregiver.  Why do you want to quit?  If you tried to quit in the past, what helped and what did not?  What will be the most difficult situations for you after you quit? How will you plan to handle them?  Who can help you through the tough times? Your family? Friends? A caregiver?  What pleasures do you get from smoking? What ways can you still get pleasure if you quit? Here are some questions to ask your caregiver:  How can you help me to be successful at quitting?  What medicine do you think would be best for me and how should I take it?  What should I do if I need more help?  What is smoking withdrawal like?  How can I get information on withdrawal? GET READY  Set a quit date.  Change your environment by getting rid of all cigarettes, ashtrays, matches, and lighters in your home, car, or work. Do not let people smoke in your home.  Review your past attempts to quit. Think about what worked and what did not. GET SUPPORT AND ENCOURAGEMENT You have a better chance of being successful if you have help. You can get support in many ways.  Tell your family, friends, and co-workers that you are going to quit and need their support. Ask them not to smoke around you.  Get individual, group, or telephone counseling and support. Programs are available at [employer] and health centers. Call your local health department for information about programs in your area.  Spiritual beliefs and practices may help some smokers quit.  Download a "quit meter" on your computer to keep track of quit statistics, such as how long you have gone without smoking, cigarettes not smoked, and money saved.  Get a self-help book about quitting smoking and staying off of tobacco. LEARN NEW SKILLS AND BEHAVIORS  Distract yourself from urges to smoke. Talk to someone, go for a walk, or occupy your time with a task.  Change your normal routine. Take a different route to work. Drink tea instead of coffee. Eat breakfast in a different place.  Reduce your stress. Take a hot bath, exercise, or read a book.  Plan something enjoyable to do every day. Reward yourself for not smoking.  Explore interactive web-based programs that specialize in helping you quit. GET MEDICINE AND USE IT CORRECTLY Medicines can help you stop smoking and decrease the urge to smoke. Combining medicine with the above behavioral methods and support can greatly increase your chances of successfully quitting smoking.  Nicotine replacement therapy helps deliver nicotine to your body without the negative effects and risks of smoking. Nicotine replacement  therapy includes nicotine gum, lozenges, inhalers, nasal sprays, and skin patches. Some may be available over-the-counter and others require a prescription.  Antidepressant medicine helps people abstain from smoking, but how this works is unknown. This medicine is available by prescription.  Nicotinic receptor partial agonist medicine simulates the effect of nicotine in your brain. This medicine is available by prescription. Ask your caregiver for advice about which medicines to use and how to use them based on your health history. Your caregiver will tell you what side effects to look out for if you choose to be on a medicine or therapy. Carefully read the information on the package. Do not  use any other product containing nicotine while using a nicotine replacement product.  RELAPSE OR DIFFICULT SITUATIONS Most relapses occur within the first 3 months after quitting. Do not be discouraged if you start smoking again. Remember, most people try several times before finally quitting. You may have symptoms of withdrawal because your body is used to nicotine. You may crave cigarettes, be irritable, feel very hungry, cough often, get headaches, or have difficulty concentrating. The withdrawal symptoms are only temporary. They are strongest when you first quit, but they will go away within 10 14 days. To reduce the chances of relapse, try to:  Avoid drinking alcohol. Drinking lowers your chances of successfully quitting.  Reduce the amount of caffeine you consume. Once you quit smoking, the amount of caffeine in your body increases and can give you symptoms, such as a rapid heartbeat, sweating, and anxiety.  Avoid smokers because they can make you want to smoke.  Do not let weight gain distract you. Many smokers will gain weight when they quit, usually less than 10 pounds. Eat a healthy diet and stay active. You can always lose the weight gained after you quit.  Find ways to improve your mood other than  smoking. FOR MORE INFORMATION  www.smokefree.gov  Document Released: 11/14/2001 Document Revised: 05/21/2012 Document Reviewed: 02/29/2012 [facility] Patient Information 2013 [place], [place].

## 2013-02-24 NOTE — Progress Notes (Signed)
  Subjective:    Patient ID: Rebekah [name], female    DOB: January 21, 1986, 27 y.o.   MRN: [id number]  HPI Patient in today for CPE with PAP. Sexually active. States that she practices Safe sex. G2 P2 A0. Only C?O low back pain intermittently. Usually occurs when lying down. Flexeril has helped in past and patient only took 1-2 X per month.    Review of Systems  Constitutional: Negative.   HENT: Negative.   Eyes: Negative.   Respiratory: Negative.   Cardiovascular: Negative.   Gastrointestinal: Negative.   Endocrine: Negative.   Genitourinary: Negative.   Musculoskeletal: Negative.   Skin: Negative.   Allergic/Immunologic: Negative.   Neurological: Negative.   Hematological: Negative.   Psychiatric/Behavioral: Negative.        Objective:   Physical Exam  Vitals reviewed. Constitutional: She is oriented to person, place, and time. She appears well-developed and well-nourished.  HENT:  Head: Normocephalic and atraumatic.  Right Ear: External ear normal.  Left Ear: External ear normal.  Mouth/Throat: Oropharynx is clear and moist.  Eyes: Conjunctivae and EOM are normal. Pupils are equal, round, and reactive to light.  Neck: Normal range of motion. Neck supple.  Cardiovascular: Normal rate, regular rhythm, normal heart sounds and intact distal pulses.   Pulmonary/Chest: Effort normal and breath sounds normal.  Abdominal: Soft. Bowel sounds are normal.  Genitourinary: Vagina normal and uterus normal. Right adnexum displays no mass. Left adnexum displays no mass.  Cervix parous pink  Musculoskeletal: Normal range of motion.  Neurological: She is alert and oriented to person, place, and time. She has normal reflexes.  Skin: Skin is warm and dry.  Psychiatric: She has a normal mood and affect. Her behavior is normal. Judgment and thought content normal.   BP 130/72  Pulse 79  Temp(Src) 99.2 F (37.3 C) (Oral)  Ht 5\' 4"  (1.626 m)  Wt 145 lb (65.772 kg)  BMI 24.88 kg/m2  LMP  02/10/2013   Results for orders placed in visit on 02/24/13  POCT URINALYSIS DIPSTICK      Result Value Range   Color, UA amber     Clarity, UA cloudy     Glucose, UA neg     Bilirubin, UA neg     Ketones, UA neg     Spec Grav, UA >=1.030     Blood, UA large     pH, UA 5.0     Protein, UA neg     Urobilinogen, UA negative     Nitrite, UA neg     Leukocytes, UA Trace    POCT UA - MICROSCOPIC ONLY      Result Value Range   WBC, Ur, HPF, POC 5-10     RBC, urine, microscopic 10-15     Bacteria, U Microscopic neg     Mucus, UA neg     Epithelial cells, urine per micros occ     Crystals, Ur, HPF, POC neg     Casts, Ur, LPF, POC neg     Yeast, UA neg    POCT HEMOGLOBIN      Result Value Range   Hemoglobin 11.8 (*) 12.2 - 16.2 g/dL         Assessment & Plan:  CPE with PAP Anemia- Multivitamin OTC daily with iron Diet and exercise encouraged RTO in 1 year Flexeril 5mg  1 po qhs prn  Mary-Margaret Daphine Deutscher, FNP

## 2013-02-26 LAB — PAP IG, CT-NG, RFX HPV ASCU
Chlamydia Probe Amp: NEGATIVE
GC Probe Amp: NEGATIVE

## 2013-02-28 LAB — HUMAN PAPILLOMAVIRUS, HIGH RISK: HPV DNA High Risk: NOT DETECTED

## 2013-03-03 NOTE — Progress Notes (Signed)
Patient aware.

## 2013-04-16 ENCOUNTER — Telehealth: Payer: Self-pay | Admitting: Nurse Practitioner

## 2013-04-16 NOTE — Telephone Encounter (Signed)
Pt requested appt for Monday 5/19 and appt given

## 2013-04-21 ENCOUNTER — Ambulatory Visit (INDEPENDENT_AMBULATORY_CARE_PROVIDER_SITE_OTHER): Payer: Medicaid Other | Admitting: Nurse Practitioner

## 2013-04-21 ENCOUNTER — Encounter: Payer: Self-pay | Admitting: Nurse Practitioner

## 2013-04-21 VITALS — BP 142/77 | HR 117 | Temp 97.3°F | Wt 137.0 lb

## 2013-04-21 DIAGNOSIS — R21 Rash and other nonspecific skin eruption: Secondary | ICD-10-CM

## 2013-04-21 LAB — COMPLETE METABOLIC PANEL WITH GFR
ALT: 8 U/L (ref 0–35)
AST: 13 U/L (ref 0–37)
Albumin: 4.7 g/dL (ref 3.5–5.2)
Alkaline Phosphatase: 50 U/L (ref 39–117)
BUN: 13 mg/dL (ref 6–23)
CO2: 25 mEq/L (ref 19–32)
Calcium: 10.1 mg/dL (ref 8.4–10.5)
Chloride: 103 mEq/L (ref 96–112)
Creat: 0.92 mg/dL (ref 0.50–1.10)
GFR, Est African American: >89 mL/min
GFR, Est Non African American: 86 mL/min
Glucose, Bld: 90 mg/dL (ref 70–99)
Potassium: 4.2 mEq/L (ref 3.5–5.3)
Sodium: 140 mEq/L (ref 135–145)
Total Bilirubin: 0.6 mg/dL (ref 0.3–1.2)
Total Protein: 7.3 g/dL (ref 6.0–8.3)

## 2013-04-21 MED ORDER — METHYLPREDNISOLONE ACETATE 80 MG/ML IJ SUSP
80.0000 mg | Freq: Once | INTRAMUSCULAR | Status: AC
  Administered 2013-04-21: 80 mg via INTRAMUSCULAR

## 2013-04-21 NOTE — Progress Notes (Signed)
  Subjective:    Patient ID: Rebekah [name], female    DOB: August 13, 1986, 27 y.o.   MRN: [id number]  HPI- Patient in C/O a rash that developed 1 month ago- started out on her legs and would itch- Then it worked its way up to abdomen then hands and now is on her feet.    Review of Systems  Constitutional: Negative.   HENT: Negative.   Eyes: Negative.   Respiratory: Negative.   Cardiovascular: Negative.   Gastrointestinal: Negative.   Endocrine: Negative.   Genitourinary: Negative.   Skin: Positive for rash.  Allergic/Immunologic: Negative.   Neurological: Negative.   Hematological: Negative.   Psychiatric/Behavioral: Negative.        Objective:   Physical Exam  Constitutional: She appears well-developed and well-nourished.  Cardiovascular: Normal rate and normal heart sounds.   Pulmonary/Chest: Effort normal and breath sounds normal.  Abdominal: Soft.  Skin:  Small flesh colored clear fluid filled nodules on palms of hands and soles and tops of feet.          Assessment & Plan:

## 2013-04-21 NOTE — Patient Instructions (Signed)
Safer Sex  Your caregiver wants you to have this information about the infections that can be transmitted from sexual contact and how to prevent them. The idea behind safer sex is that you can be sexually active, and at the same time reduce the risk of giving or getting a sexually transmitted disease (STD). Every person should be aware of how to prevent him or herself and his or her sex partner from getting an STD.  CAUSES OF STDS  STDs are transmitted by sharing body fluids, which contain viruses and bacteria. The following fluids all transmit infections during sexual intercourse and sex acts:  · Semen.  · Saliva.  · Urine.  · Blood.  · Vaginal mucus.  Examples of STDs include:  · Chlamydia.  · Gonorrhea.  · Genital herpes.  · Hepatitis B.  · Human immunodeficiency virus or acquired immunodeficiency syndrome (HIV or AIDS).  · Syphilis.  · Trichomonas.  · Pubic lice.  · Human papillomavirus (HPV), which may include:  · Genital warts.  · Cervical dysplasia.  · Cervical cancer (can develop with certain types of HPV).  SYMPTOMS   Sexual diseases often cause few or no symptoms until they are advanced, so a person can be infected and spread the infection without knowing it. Some STDs respond to treatment very well. Others, like HIV and herpes, cannot be cured, but are treated to reduce their effects.  Specific symptoms include:  · Abnormal vaginal discharge.  · Irritation or itching in and around the vagina, and in the pubic hair.  · Pain during sexual intercourse.  · Bleeding during sexual intercourse.  · Pelvic or abdominal pain.  · Fever.  · Growths in and around the vagina.  · An ulcer in or around the vagina.  · Swollen glands in the groin area.  DIAGNOSIS   · Blood tests.  · Pap test.  · Culture test of abnormal vaginal discharge.  · A test that applies a solution and examines the cervix with a lighted magnifying scope (colposcopy).  · A test that examines the pelvis with a lighted tube, through a small incision  (laparoscopy).  TREATMENT   The treatment will depend on the cause of the STD.  · Antibiotic treatment by injection, oral, creams, or suppositories in the vagina.  · Over-the-counter medicated shampoo, to get rid of pubic lice.  · Removing or treating growths with medicine, freezing, burning (electrocautery), or surgery.  · Surgery treatment for HPV of the cervix.  · Supportive medicines for herpes, HIV, AIDS, and hepatitis.  Being careful cannot eliminate all risk of infection, but sex can be made much safer.  Safe sexual practices include body massage and gentle touching. Masturbation is safe, as long as body fluids do not contact skin that has sores or cuts. Dry kissing and oral sex on a man wearing a latex condom or on a woman wearing a female condom is also safe. Slightly less safe is intercourse while the man wears a latex condom or wet kissing. It is also safer to have one sex partner that you know is not having sex with anyone else.  LENGTH OF ILLNESS  An STD might be treated and cured in a week, sometimes a month, or more. And it can linger with symptoms for many years. STDs can also cause damage to the female organs. This can cause chronic pain, infertility, and recurrence of the STD, especially herpes, hepatitis, HIV, and HPV.  HOME CARE INSTRUCTIONS AND PREVENTION  · Alcohol   and recreational drugs are often the reason given for not practicing safer sex. These substances affect your judgment. Alcohol and recreational drugs can also impair your immune system, making you more vulnerable to disease.  · Do not engage in risky and dangerous sexual practices, including:  · Vaginal or anal sex without a condom.  · Oral sex on a man without a condom.  · Oral sex on a woman without a female condom.  · Using saliva to lubricate a condom.  · Any other sexual contact in which body fluids or blood from one partner contact the other partner.  · You should use only latex condoms for men and water soluble lubricants.  Petroleum based lubricants or oils used to lubricate a condom will weaken the condom and increase the chance that it will break.  · Think very carefully before having sex with anyone who is high risk for STDs and HIV. This includes IV drug users, people with multiple sexual partners, or people who have had an STD, or a positive hepatitis or HIV blood test.  · Remember that even if your partner has had only one previous partner, their previous partner might have had multiple partners. If so, you are at high risk of being exposed to an STD. You and your sex partner should be the only sex partners with each other, with no one else involved.  · A vaccine is available for hepatitis B and HPV through your caregiver or the Public Health Department. Everyone should be vaccinated with these vaccines.  · Avoid risky sex practices. Sex acts that can break the skin make you more likely to get an STD.  SEEK MEDICAL CARE IF:   · If you think you have an STD, even if you do not have any symptoms. Contact your caregiver for evaluation and treatment, if needed.  · You think or know your sex partner has acquired an STD.  · You have any of the symptoms mentioned above.  Document Released: 12/28/2004 Document Revised: 02/12/2012 Document Reviewed: 10/20/2009  [employer]® Patient Information ©2013 [employer].

## 2013-04-22 ENCOUNTER — Telehealth: Payer: Self-pay | Admitting: Nurse Practitioner

## 2013-04-22 NOTE — Telephone Encounter (Signed)
Ok to add CBC 

## 2013-04-24 LAB — SYPHILIS ANTIBODY, IGM, ELISA: Syphilis Antibody, IgM, ELISA: 0.6 IV (ref <=0.8)

## 2013-06-03 ENCOUNTER — Telehealth: Payer: Self-pay | Admitting: Nurse Practitioner

## 2013-06-04 ENCOUNTER — Encounter: Payer: Self-pay | Admitting: General Practice

## 2013-06-04 ENCOUNTER — Ambulatory Visit (INDEPENDENT_AMBULATORY_CARE_PROVIDER_SITE_OTHER): Payer: Medicaid Other | Admitting: General Practice

## 2013-06-04 ENCOUNTER — Telehealth: Payer: Self-pay | Admitting: General Practice

## 2013-06-04 VITALS — BP 136/94 | HR 112 | Temp 99.5°F | Ht 64.0 in | Wt 133.0 lb

## 2013-06-04 DIAGNOSIS — L0231 Cutaneous abscess of buttock: Secondary | ICD-10-CM

## 2013-06-04 DIAGNOSIS — L03317 Cellulitis of buttock: Secondary | ICD-10-CM

## 2013-06-04 MED ORDER — SULFAMETHOXAZOLE-TRIMETHOPRIM 800-160 MG PO TABS: 1.0000 | ORAL_TABLET | Freq: Two times a day (BID) | ORAL | Status: DC

## 2013-06-04 NOTE — Patient Instructions (Signed)
Abscess An abscess is an infected area that contains a collection of pus and debris.It can occur in almost any part of the body. An abscess is also known as a furuncle or boil. CAUSES  An abscess occurs when tissue gets infected. This can occur from blockage of oil or sweat glands, infection of hair follicles, or a minor injury to the skin. As the body tries to fight the infection, pus collects in the area and creates pressure under the skin. This pressure causes pain. People with weakened immune systems have difficulty fighting infections and get certain abscesses more often.  SYMPTOMS Usually an abscess develops on the skin and becomes a painful mass that is red, warm, and tender. If the abscess forms under the skin, you may feel a moveable soft area under the skin. Some abscesses break open (rupture) on their own, but most will continue to get worse without care. The infection can spread deeper into the body and eventually into the bloodstream, causing you to feel ill.  DIAGNOSIS  Your caregiver will take your medical history and perform a physical exam. A sample of fluid may also be taken from the abscess to determine what is causing your infection. TREATMENT  Your caregiver may prescribe antibiotic medicines to fight the infection. However, taking antibiotics alone usually does not cure an abscess. Your caregiver may need to make a small cut (incision) in the abscess to drain the pus. In some cases, gauze is packed into the abscess to reduce pain and to continue draining the area. HOME CARE INSTRUCTIONS   Only take over-the-counter or prescription medicines for pain, discomfort, or fever as directed by your caregiver.  If you were prescribed antibiotics, take them as directed. Finish them even if you start to feel better.  If gauze is used, follow your caregiver's directions for changing the gauze.  To avoid spreading the infection:  Keep your draining abscess covered with a  bandage.  Wash your hands well.  Do not share personal care items, towels, or whirlpools with others.  Avoid skin contact with others.  Keep your skin and clothes clean around the abscess.  Keep all follow-up appointments as directed by your caregiver. SEEK MEDICAL CARE IF:   You have increased pain, swelling, redness, fluid drainage, or bleeding.  You have muscle aches, chills, or a general ill feeling.  You have a fever. MAKE SURE YOU:   Understand these instructions.  Will watch your condition.  Will get help right away if you are not doing well or get worse. Document Released: 08/30/2005 Document Revised: 05/21/2012 Document Reviewed: 02/02/2012 [employer] Patient Information 2014 [employer].  

## 2013-06-04 NOTE — Progress Notes (Signed)
  Subjective:    Patient ID: Rebekah [name], female    DOB: 1986-01-30, 27 y.o.   MRN: [id number]  HPI Patient presents today with complaints a boil on buttocks. She reports onset was four days ago. She reports having boils in the past. Reports last menstrual cycle was June 26.     Review of Systems  Constitutional: Negative for fever and chills.  Respiratory: Negative for shortness of breath.   Cardiovascular: Negative for chest pain and palpitations.  Gastrointestinal: Negative for abdominal pain.  Genitourinary: Negative for dysuria and difficulty urinating.  Skin:       Right buttocks area-boil  Neurological: Negative for dizziness, weakness and headaches.       Objective:   Physical Exam  Constitutional: She is oriented to person, place, and time. She appears well-developed and well-nourished.  Cardiovascular: Normal rate, regular rhythm and normal heart sounds.   Pulmonary/Chest: Effort normal and breath sounds normal. No respiratory distress. She exhibits no tenderness.  Neurological: She is alert and oriented to person, place, and time.  Skin: Skin is warm and dry.  Quarter size erythematous area to right buttocks. Negative for drainage. Tender and warm to touch.   Psychiatric: She has a normal mood and affect.          Assessment & Plan:  1. Cellulitis and abscess of buttock - sulfamethoxazole-trimethoprim (BACTRIM DS,SEPTRA DS) 800-160 MG per tablet; Take 1 tablet by mouth 2 (two) times daily.  Dispense: 20 tablet; Refill: 0 -keep area clean and dry -warm compresses to affected area for 10-15 minutes four times daily -RTO for follow up as scheduled and sooner if symptoms worsen -Patient verbalized understanding -Coralie Keens, FNP-C

## 2013-06-05 ENCOUNTER — Telehealth: Payer: Self-pay | Admitting: General Practice

## 2013-06-05 NOTE — Telephone Encounter (Signed)
LMOM, RX should be ready.

## 2013-06-05 NOTE — Telephone Encounter (Signed)
Pt aware, only to receive Biaxen.

## 2013-07-11 ENCOUNTER — Telehealth: Payer: Self-pay | Admitting: Nurse Practitioner

## 2013-07-11 NOTE — Telephone Encounter (Signed)
appt given for Sat am with [facility]

## 2013-07-12 ENCOUNTER — Ambulatory Visit (INDEPENDENT_AMBULATORY_CARE_PROVIDER_SITE_OTHER): Payer: Medicaid Other | Admitting: General Practice

## 2013-07-12 VITALS — BP 145/84 | HR 77 | Temp 99.2°F | Ht 63.0 in | Wt 138.0 lb

## 2013-07-12 DIAGNOSIS — R112 Nausea with vomiting, unspecified: Secondary | ICD-10-CM

## 2013-07-12 DIAGNOSIS — L0291 Cutaneous abscess, unspecified: Secondary | ICD-10-CM

## 2013-07-12 DIAGNOSIS — L0231 Cutaneous abscess of buttock: Secondary | ICD-10-CM

## 2013-07-12 DIAGNOSIS — L03317 Cellulitis of buttock: Secondary | ICD-10-CM

## 2013-07-12 MED ORDER — SULFAMETHOXAZOLE-TRIMETHOPRIM 800-160 MG PO TABS: 1.0000 | ORAL_TABLET | Freq: Two times a day (BID) | ORAL | Status: DC

## 2013-07-12 NOTE — Patient Instructions (Signed)
Abscess An abscess is an infected area that contains a collection of pus and debris.It can occur in almost any part of the body. An abscess is also known as a furuncle or boil. CAUSES  An abscess occurs when tissue gets infected. This can occur from blockage of oil or sweat glands, infection of hair follicles, or a minor injury to the skin. As the body tries to fight the infection, pus collects in the area and creates pressure under the skin. This pressure causes pain. People with weakened immune systems have difficulty fighting infections and get certain abscesses more often.  SYMPTOMS Usually an abscess develops on the skin and becomes a painful mass that is red, warm, and tender. If the abscess forms under the skin, you may feel a moveable soft area under the skin. Some abscesses break open (rupture) on their own, but most will continue to get worse without care. The infection can spread deeper into the body and eventually into the bloodstream, causing you to feel ill.  DIAGNOSIS  Your caregiver will take your medical history and perform a physical exam. A sample of fluid may also be taken from the abscess to determine what is causing your infection. TREATMENT  Your caregiver may prescribe antibiotic medicines to fight the infection. However, taking antibiotics alone usually does not cure an abscess. Your caregiver may need to make a small cut (incision) in the abscess to drain the pus. In some cases, gauze is packed into the abscess to reduce pain and to continue draining the area. HOME CARE INSTRUCTIONS   Only take over-the-counter or prescription medicines for pain, discomfort, or fever as directed by your caregiver.  If you were prescribed antibiotics, take them as directed. Finish them even if you start to feel better.  If gauze is used, follow your caregiver's directions for changing the gauze.  To avoid spreading the infection:  Keep your draining abscess covered with a  bandage.  Wash your hands well.  Do not share personal care items, towels, or whirlpools with others.  Avoid skin contact with others.  Keep your skin and clothes clean around the abscess.  Keep all follow-up appointments as directed by your caregiver. SEEK MEDICAL CARE IF:   You have increased pain, swelling, redness, fluid drainage, or bleeding.  You have muscle aches, chills, or a general ill feeling.  You have a fever. MAKE SURE YOU:   Understand these instructions.  Will watch your condition.  Will get help right away if you are not doing well or get worse. Document Released: 08/30/2005 Document Revised: 05/21/2012 Document Reviewed: 02/02/2012 [employer] Patient Information 2014 [employer].  

## 2013-07-12 NOTE — Progress Notes (Signed)
  Subjective:    Patient ID: Rebekah [name], female    DOB: 1986/09/07, 27 y.o.   MRN: [id number]  HPI Patient presents today with complaints of a painful bump in vaginal area. Reports onset was 3 days ago and gradually increased in size and tenderness. She denies taking OTC medications. Reports vomiting daily times one weeks. Reports last menstrual cycle was 2.5 weeks ago.     Review of Systems  Constitutional: Negative for fever and chills.  Respiratory: Negative for chest tightness and shortness of breath.   Cardiovascular: Negative for chest pain and palpitations.  Gastrointestinal: Positive for nausea and vomiting. Negative for abdominal pain.  Genitourinary: Negative for hematuria and difficulty urinating.  Neurological: Negative for dizziness, weakness and headaches.       Objective:   Physical Exam  Constitutional: She is oriented to person, place, and time. She appears well-developed and well-nourished.  Cardiovascular: Normal rate, regular rhythm and normal heart sounds.   Pulmonary/Chest: Effort normal and breath sounds normal.  Abdominal: Soft. Bowel sounds are normal. She exhibits no distension. There is no tenderness.  Neurological: She is alert and oriented to person, place, and time.  Skin: Skin is warm and dry. There is erythema.  Erthema, and warmth to 1 inch x 1/2 inch area to left labia majora. Negative for drainage.   Psychiatric: She has a normal mood and affect.          Assessment & Plan:  1. Cellulitis and abscess  - sulfamethoxazole-trimethoprim (BACTRIM DS,SEPTRA DS) 800-160 MG per tablet; Take 1 tablet by mouth 2 (two) times daily.  Dispense: 20 tablet; Refill: 0 -keep skin warm and dry -warm  Compresses three times daily -use back up contraceptive method while taking antibiotics -RTO if symptoms worsen or unresolved  2. Nausea with vomiting -negative urine pregnancy -increase fluid intake -RTO if symptoms worsen or unresolved in 24  hours -Patient verbalized understanding -Coralie Keens, FNP-C

## 2013-07-15 ENCOUNTER — Encounter: Payer: Self-pay | Admitting: General Practice

## 2013-12-17 ENCOUNTER — Telehealth: Payer: Self-pay | Admitting: *Deleted

## 2013-12-17 MED ORDER — NORGESTIM-ETH ESTRAD TRIPHASIC 0.18/0.215/0.25 MG-35 MCG PO TABS: 1.0000 | ORAL_TABLET | Freq: Every day | ORAL | Status: DC

## 2013-12-17 NOTE — Telephone Encounter (Signed)
Patient lost insurance and cant afford birth control can we change it tri trevisem

## 2013-12-23 ENCOUNTER — Ambulatory Visit (INDEPENDENT_AMBULATORY_CARE_PROVIDER_SITE_OTHER): Payer: Medicaid Other | Admitting: Family Medicine

## 2013-12-23 ENCOUNTER — Telehealth: Payer: Self-pay | Admitting: Family Medicine

## 2013-12-23 ENCOUNTER — Encounter: Payer: Self-pay | Admitting: Family Medicine

## 2013-12-23 ENCOUNTER — Encounter (INDEPENDENT_AMBULATORY_CARE_PROVIDER_SITE_OTHER): Payer: Self-pay

## 2013-12-23 ENCOUNTER — Ambulatory Visit (HOSPITAL_COMMUNITY)
Admission: RE | Admit: 2013-12-23 | Discharge: 2013-12-23 | Disposition: A | Discharge status: Home / Self Care | Payer: Self-pay | Source: Ambulatory Visit | Attending: Family Medicine | Admitting: Family Medicine

## 2013-12-23 VITALS — BP 155/87 | HR 108 | Temp 99.3°F | Ht 63.0 in | Wt 131.0 lb

## 2013-12-23 DIAGNOSIS — R82998 Other abnormal findings in urine: Secondary | ICD-10-CM

## 2013-12-23 DIAGNOSIS — R112 Nausea with vomiting, unspecified: Secondary | ICD-10-CM

## 2013-12-23 DIAGNOSIS — R1110 Vomiting, unspecified: Secondary | ICD-10-CM | POA: Insufficient documentation

## 2013-12-23 LAB — POCT UA - MICROSCOPIC ONLY
Bacteria, U Microscopic: NEGATIVE
Casts, Ur, LPF, POC: NEGATIVE
Crystals, Ur, HPF, POC: NEGATIVE
Mucus, UA: NEGATIVE
WBC, Ur, HPF, POC: NEGATIVE

## 2013-12-23 LAB — POCT URINALYSIS DIPSTICK
Bilirubin, UA: NEGATIVE
Glucose, UA: NEGATIVE
Ketones, UA: NEGATIVE
Leukocytes, UA: NEGATIVE
Nitrite, UA: NEGATIVE
Spec Grav, UA: 1.01
Urobilinogen, UA: 0.2
pH, UA: 8

## 2013-12-23 LAB — POCT URINE PREGNANCY: Preg Test, Ur: NEGATIVE

## 2013-12-23 MED ORDER — ONDANSETRON HCL 4 MG PO TABS: 4.0000 mg | ORAL_TABLET | Freq: Three times a day (TID) | ORAL | Status: DC | PRN

## 2013-12-23 MED ORDER — PANTOPRAZOLE SODIUM 40 MG PO TBEC
40.0000 mg | DELAYED_RELEASE_TABLET | Freq: Every day | ORAL | Status: DC
Start: 2013-12-23 — End: 2014-03-09

## 2013-12-23 NOTE — Telephone Encounter (Signed)
Pt can take OTC prilosec for heartburn.  Pt way want to check on other pharmacies for generic zofran if this is a problem.

## 2013-12-23 NOTE — Progress Notes (Signed)
   Subjective:    Patient ID: Rebekah [name], female    DOB: Nov 12, 1986, 28 y.o.   MRN: [id number]  HPI Patient presents today with chief complaint of emesis times one week. The patient reports recurrent episodes of nausea and vomiting over the past week. Episodes of a primarily nonbloody nonbilious. I1 questionable episode of mild blood-tinged emesis earlier in the week. Patient states she was drinking a lot of red color drinks at a time. Denies any diarrhea or dysuria. Denies any vaginal discharge. Minimal to mild epigastric pain. Has been able to hold down fluids. Last menstrual cycle was one week ago. Denies any ETOH abuse   Review of Systems  All other systems reviewed and are negative.       Objective:   Physical Exam  Constitutional: She is oriented to person, place, and time. She appears well-developed and well-nourished.  HENT:  Head: Normocephalic and atraumatic.  Eyes: Conjunctivae are normal. Pupils are equal, round, and reactive to light.  Neck: Normal range of motion. Neck supple.  Cardiovascular: Normal rate and regular rhythm.   Pulmonary/Chest: Effort normal and breath sounds normal.  Abdominal: Soft. Bowel sounds are normal.  Mild epigastric and right upper quadrant tenderness to palpation.  Musculoskeletal: Normal range of motion.  Neurological: She is alert and oriented to person, place, and time.  Skin: Skin is warm.          Assessment & Plan:  Nausea with vomiting - Plan: POCT urine pregnancy, US Abdomen Complete, Comprehensive metabolic panel, CBC With differential/Platelet  Dark urine - Plan: POCT UA - Microscopic Only, POCT urinalysis dipstick  Differential diagnoses for symptoms fairly broad. Suspect there may be a component of mild gastritis versus gallbladder disease given overall exam and history. Urine pregnancy negative today. Placed on Zofran as well as Protonix. Plan to check a abdominal ultrasound to the valve and gallbladder  disease. Also check CBC and CMP. Discuss smoking cessation. Also discussed GI red flags including hematemesis.  Follow up pending ultrasound.

## 2013-12-24 LAB — COMPREHENSIVE METABOLIC PANEL
ALT: 10 IU/L (ref 0–32)
AST: 13 IU/L (ref 0–40)
Albumin/Globulin Ratio: 2.3 (ref 1.1–2.5)
Albumin: 5.2 g/dL (ref 3.5–5.5)
Alkaline Phosphatase: 50 IU/L (ref 39–117)
BUN/Creatinine Ratio: 17 (ref 8–20)
BUN: 17 mg/dL (ref 6–20)
CO2: 22 mmol/L (ref 18–29)
Calcium: 9.7 mg/dL (ref 8.7–10.2)
Chloride: 100 mmol/L (ref 97–108)
Creatinine, Ser: 1.01 mg/dL — ABNORMAL HIGH (ref 0.57–1.00)
GFR calc Af Amer: 88 mL/min/{1.73_m2} (ref >59)
GFR calc non Af Amer: 76 mL/min/{1.73_m2} (ref >59)
Globulin, Total: 2.3 g/dL (ref 1.5–4.5)
Glucose: 97 mg/dL (ref 65–99)
Potassium: 4 mmol/L (ref 3.5–5.2)
Sodium: 142 mmol/L (ref 134–144)
Total Bilirubin: 0.5 mg/dL (ref 0.0–1.2)
Total Protein: 7.5 g/dL (ref 6.0–8.5)

## 2013-12-24 LAB — CBC WITH DIFFERENTIAL
Basophils Absolute: 0 10*3/uL (ref 0.0–0.2)
Basos: 0 %
Eos: 3 %
Eosinophils Absolute: 0.2 10*3/uL (ref 0.0–0.4)
HCT: 41.9 % (ref 34.0–46.6)
Hemoglobin: 14.3 g/dL (ref 11.1–15.9)
Immature Grans (Abs): 0 10*3/uL (ref 0.0–0.1)
Immature Granulocytes: 0 %
Lymphocytes Absolute: 1.6 10*3/uL (ref 0.7–3.1)
Lymphs: 24 %
MCH: 32 pg (ref 26.6–33.0)
MCHC: 34.1 g/dL (ref 31.5–35.7)
MCV: 94 fL (ref 79–97)
Monocytes Absolute: 0.5 10*3/uL (ref 0.1–0.9)
Monocytes: 8 %
Neutrophils Absolute: 4.2 10*3/uL (ref 1.4–7.0)
Neutrophils Relative %: 65 %
Platelets: 230 10*3/uL (ref 150–379)
RBC: 4.47 x10E6/uL (ref 3.77–5.28)
RDW: 12.7 % (ref 12.3–15.4)
WBC: 6.6 10*3/uL (ref 3.4–10.8)

## 2013-12-24 NOTE — Telephone Encounter (Signed)
CALL Rebekah [name] ON CELL #  ABOUT MEDICATION- CELL # [id number] MESSAGE IF ANOTHER MED CAN BE CALLED IN THAT IS NOT AS EXPENSIVE.  SHE IS STILL SICK & UNABLE TO SPEAK TO NURSE BUT LEAVE MESSAGE BECAUSE SHE NEEDS MED TODAY.

## 2013-12-24 NOTE — Telephone Encounter (Signed)
There were several things rx that day - which one is she talking about?

## 2013-12-25 NOTE — Telephone Encounter (Signed)
Advised to buy omeprazole OTC. Patient is ok with that. Zofran generic was too expensive as well. Has taken phenergan in the past. Would you want to switch to that? Explained that phenergan is sedative and it may be hard to work or drive if taking that.

## 2013-12-26 ENCOUNTER — Other Ambulatory Visit: Payer: Self-pay | Admitting: Family Medicine

## 2013-12-26 MED ORDER — PROMETHAZINE HCL 25 MG PO TABS: 25.0000 mg | ORAL_TABLET | Freq: Four times a day (QID) | ORAL | Status: DC | PRN

## 2013-12-26 NOTE — Telephone Encounter (Signed)
Left message on voice mail that prescription was sent to pharmacy.

## 2013-12-26 NOTE — Telephone Encounter (Signed)
Called in phenergan.  Please inform pt.

## 2014-02-10 ENCOUNTER — Other Ambulatory Visit: Payer: Self-pay | Admitting: Nurse Practitioner

## 2014-02-12 NOTE — Telephone Encounter (Signed)
Message left that rx sent pharmacy

## 2014-02-12 NOTE — Telephone Encounter (Signed)
ntbs for pap 

## 2014-02-26 ENCOUNTER — Telehealth: Payer: Self-pay | Admitting: General Practice

## 2014-02-26 NOTE — Telephone Encounter (Signed)
Pt not able to keep food or liquids down since last Friday, urine is dark, decreased output. Advised pt to go to ER now. She verbalized understanding.

## 2014-02-27 ENCOUNTER — Telehealth: Payer: Self-pay | Admitting: General Practice

## 2014-02-27 NOTE — Telephone Encounter (Signed)
I spoke with Rebekah [name] and she said it was ok for [place] to write work note for today.patient aware and note left up front for patient

## 2014-03-03 ENCOUNTER — Telehealth: Payer: Self-pay | Admitting: General Practice

## 2014-03-03 NOTE — Telephone Encounter (Signed)
appt given for tomorrow

## 2014-03-04 ENCOUNTER — Ambulatory Visit (INDEPENDENT_AMBULATORY_CARE_PROVIDER_SITE_OTHER): Payer: Medicaid Other | Admitting: General Practice

## 2014-03-04 ENCOUNTER — Encounter: Payer: Self-pay | Admitting: General Practice

## 2014-03-04 VITALS — BP 130/74 | HR 79 | Temp 98.3°F | Ht 63.0 in | Wt 132.0 lb

## 2014-03-04 DIAGNOSIS — R110 Nausea: Secondary | ICD-10-CM

## 2014-03-04 DIAGNOSIS — N390 Urinary tract infection, site not specified: Secondary | ICD-10-CM

## 2014-03-04 LAB — POCT UA - MICROSCOPIC ONLY
Casts, Ur, LPF, POC: NEGATIVE
Crystals, Ur, HPF, POC: NEGATIVE
Mucus, UA: NEGATIVE
Yeast, UA: NEGATIVE

## 2014-03-04 LAB — POCT URINALYSIS DIPSTICK
Bilirubin, UA: NEGATIVE
Glucose, UA: NEGATIVE
Ketones, UA: NEGATIVE
Leukocytes, UA: NEGATIVE
Nitrite, UA: NEGATIVE
Spec Grav, UA: <=1.005
Urobilinogen, UA: NEGATIVE
pH, UA: 8

## 2014-03-04 LAB — POCT URINE PREGNANCY: Preg Test, Ur: NEGATIVE

## 2014-03-04 MED ORDER — NITROFURANTOIN MONOHYD MACRO 100 MG PO CAPS: 100.0000 mg | ORAL_CAPSULE | Freq: Two times a day (BID) | ORAL | Status: DC

## 2014-03-04 NOTE — Patient Instructions (Addendum)
Stress Stress-related medical problems are becoming increasingly common. The body has a built-in physical response to stressful situations. Faced with pressure, challenge or danger, we need to react quickly. Our bodies release hormones such as cortisol and adrenaline to help do this. These hormones are part of the "fight or flight" response and affect the metabolic rate, heart rate and blood pressure, resulting in a heightened, stressed state that prepares the body for optimum performance in dealing with a stressful situation. It is likely that early man required these mechanisms to stay alive, but usually modern stresses do not call for this, and the same hormones released in today's world can damage health and reduce coping ability. CAUSES  Pressure to perform at work, at school or in sports.  Threats of physical violence.  Money worries.  Arguments.  Family conflicts.  Divorce or separation from significant other.  Bereavement.  New job or unemployment.  Changes in location.  Alcohol or drug abuse. SOMETIMES, THERE IS NO PARTICULAR REASON FOR DEVELOPING STRESS. Almost all people are at risk of being stressed at some time in their lives. It is important to know that some stress is temporary and some is long term.  Temporary stress will go away when a situation is resolved. Most people can cope with short periods of stress, and it can often be relieved by relaxing, taking a walk, chatting through issues with friends, or having a good night's sleep.  Chronic (long-term, continuous) stress is much harder to deal with. It can be psychologically and emotionally damaging. It can be harmful both for an individual and for friends and family. SYMPTOMS Everyone reacts to stress differently. There are some common effects that help [place] recognize it. In times of extreme stress, people may:  Shake uncontrollably.  Breathe faster and deeper than normal (hyperventilate).  Vomit.  For people  with asthma, stress can trigger an attack.  For some people, stress may trigger migraine headaches, ulcers, and body pain. PHYSICAL EFFECTS OF STRESS MAY INCLUDE:  Loss of energy.  Skin problems.  Aches and pains resulting from tense muscles, including neck ache, backache and tension headaches.  Increased pain from arthritis and other conditions.  Irregular heart beat (palpitations).  Periods of irritability or anger.  Apathy or depression.  Anxiety (feeling uptight or worrying).  Unusual behavior.  Loss of appetite.  Comfort eating.  Lack of concentration.  Loss of, or decreased, sex-drive.  Increased smoking, drinking, or recreational drug use.  For women, missed periods.  Ulcers, joint pain, and muscle pain. Post-traumatic stress is the stress caused by any serious accident, strong emotional damage, or extremely difficult or violent experience such as rape or war. Post-traumatic stress victims can experience mixtures of emotions such as fear, shame, depression, guilt or anger. It may include recurrent memories or images that may be haunting. These feelings can last for weeks, months or even years after the traumatic event that triggered them. Specialized treatment, possibly with medicines and psychological therapies, is available. If stress is causing physical symptoms, severe distress or making it difficult for you to function as normal, it is worth seeing your caregiver. It is important to remember that although stress is a usual part of life, extreme or prolonged stress can lead to other illnesses that will need treatment. It is better to visit a doctor sooner rather than later. Stress has been linked to the development of high blood pressure and heart disease, as well as insomnia and depression. There is no diagnostic test for  stress since everyone reacts to it differently. But a caregiver will be able to spot the physical symptoms, such  as:  Headaches.  Shingles.  Ulcers. Emotional distress such as intense worry, low mood or irritability should be detected when the doctor asks pertinent questions to identify any underlying problems that might be the cause. In case there are physical reasons for the symptoms, the doctor may also want to do some tests to exclude certain conditions. If you feel that you are suffering from stress, try to identify the aspects of your life that are causing it. Sometimes you may not be able to change or avoid them, but even a small change can have a positive ripple effect. A simple lifestyle change can make all the difference. STRATEGIES THAT CAN HELP DEAL WITH STRESS:  Delegating or sharing responsibilities.  Avoiding confrontations.  Learning to be more assertive.  Regular exercise.  Avoid using alcohol or street drugs to cope.  Eating a healthy, balanced diet, rich in fruit and vegetables and proteins.  Finding humor or absurdity in stressful situations.  Never taking on more than you know you can handle comfortably.  Organizing your time better to get as much done as possible.  Talking to friends or family and sharing your thoughts and fears.  Listening to music or relaxation tapes.  Tensing and then relaxing your muscles, starting at the toes and working up to the head and neck. If you think that you would benefit from help, either in identifying the things that are causing your stress or in learning techniques to help you relax, see a caregiver who is capable of helping you with this. Rather than relying on medications, it is usually better to try and identify the things in your life that are causing stress and try to deal with them. There are many techniques of managing stress including counseling, psychotherapy, aromatherapy, yoga, and exercise. Your caregiver can help you determine what is best for you. Document Released: 02/10/2003 Document Revised: 02/12/2012 Document  Reviewed: 01/07/2008 [facility] Patient Information 2014 [place], [place].  [facility]- Therapist [address] [place] ,[place] [address] [phone] Children limited to anxiety and depression- NO ADD/ADHD Does not accept Medicaid  [facility] [address] [place], [place] Does see children Does accept medicaid Will assess for Autism but not treat  [facility] [address] [place] [phone] Does see children  Does accept Medicaid Medication management- substance abuse- bipolar- grief- family-marriage- OCD- Anxiety- PTSD  [facility] Does see children Does accept medicaid They do perform psychological testing  [place] [address] [place] Schedule through [place]. [phone] Patient must call and make own appointment Does se children Does accept Medicaid  [facility] [place], [place] 7-10 accompanied by an adult, 11 and up by themselves Does accept Medicaid Will see patients with- substance abuse-ADHD-ADD-Bipolar-Domestic violence-Marriage counseling- Family Counseling and sexual abuse  [place] and Psychiatrist [address] [place] [phone] Does see children Does accept [facility] [phone]  Dr. Sabra Heck-  Psychiatrist [address], [place] Specializes in ADHD and addictions They do ADHD testing Suboxone clinic  [facility] [address] [place] Does Child psychological testing  [facility] [address] [place] [phone] Does Accept  Medicaid Evaluates for Autism  [facility] [place] [phone] Does Not accept Medicaid Does do adult ADD evaluations  Dr. Lorenza Evangelist [place], [address] [place] [phone] Does not Take Medicaid Sees ADD and ADHD for treatment      [facility] [address] [place], [place] [address] [phone] Takes Medicaid WIll see children as young as 3      Urinary Tract Infection Urinary tract infections (UTIs) can develop anywhere along your urinary tract. Your urinary tract is your body's drainage system for removing wastes and extra water. Your urinary tract includes two kidneys, two ureters, a bladder, and a urethra. Your kidneys are a pair of bean-shaped organs. Each kidney is about the size of your fist. They are located below your ribs, one on each side of your spine. CAUSES Infections are caused by microbes, which are microscopic organisms, including fungi, viruses, and bacteria. These organisms are so small that they can only be seen through a microscope. Bacteria are the microbes that most commonly cause UTIs. SYMPTOMS  Symptoms of UTIs may vary by age and gender of the patient and by the location of the infection. Symptoms in young women typically include a frequent and intense urge to urinate and a painful, burning feeling in the bladder or urethra during urination. Older women and men are more likely to be tired, shaky, and weak and have muscle aches and abdominal pain. A fever may mean the infection is in your kidneys. Other symptoms of a kidney infection include pain in your back or sides below the ribs, nausea, and vomiting. DIAGNOSIS To diagnose a UTI, your caregiver will ask you about your symptoms. Your caregiver also will ask to provide a urine sample. The urine sample will be tested for bacteria and white blood cells. White blood cells are made by your body to help fight infection. TREATMENT  Typically, UTIs can be treated with medication. Because most UTIs are  caused by a bacterial infection, they usually can be treated with the use of antibiotics. The choice of antibiotic and length of treatment depend on your symptoms and the type of bacteria causing your infection. HOME CARE INSTRUCTIONS  If you were prescribed antibiotics, take them exactly as your caregiver instructs you. Finish the medication even if you feel better after you have only taken some of the medication.  Drink enough water and fluids to keep your urine clear or pale yellow.  Avoid caffeine, tea, and carbonated beverages. They tend to irritate your bladder.  Empty your bladder often. Avoid holding urine for long periods of time.  Empty your bladder before and after sexual intercourse.  After a bowel movement, women should cleanse from front to back. Use each tissue only once. SEEK MEDICAL CARE IF:   You have back pain.  You develop a fever.  Your symptoms do not begin to resolve within 3 days. SEEK IMMEDIATE MEDICAL CARE IF:   You have severe back pain or lower abdominal pain.  You develop chills.  You have nausea or vomiting.  You have continued burning or discomfort with urination. MAKE SURE YOU:   Understand these instructions.  Will watch your condition.  Will get help right away if you are not doing well or get worse. Document Released: 08/30/2005 Document Revised: 05/21/2012 Document Reviewed: 12/29/2011 [facility] Patient Information 2014 [place].

## 2014-03-04 NOTE — Progress Notes (Signed)
   Subjective:    Patient ID: Rebekah [name], female    DOB: 06-03-1986, 28 y.o.   MRN: [id number]  HPI Patient presents today with complaints of nausea. Onset two weeks ago and has been seen by [facility] Emergency department. Denies vomiting. Taking zofran as directed, still complains of nausea. Denies abdominal pain. Last menstrual cycle was last week and only last 1.5 days. Taking birth control as directed and denies missing a dose.  Reports having a great deal of stress and feels like this may be cause of nausea.   Review of Systems  Constitutional: Negative for fever and chills.  Respiratory: Negative for chest tightness and shortness of breath.   Cardiovascular: Negative for chest pain and palpitations.  Gastrointestinal: Positive for nausea. Negative for vomiting, abdominal pain, diarrhea, constipation and blood in stool.  Genitourinary: Negative for dysuria, frequency, hematuria and difficulty urinating.       Objective:   Physical Exam  Constitutional: She is oriented to person, place, and time. She appears well-developed and well-nourished.  Cardiovascular: Normal rate, regular rhythm and normal heart sounds.   Pulmonary/Chest: Effort normal and breath sounds normal. No respiratory distress. She exhibits no tenderness.  Neurological: She is alert and oriented to person, place, and time.  Skin: Skin is warm and dry.  Psychiatric: She has a normal mood and affect.      Results for orders placed in visit on 03/04/14  POCT UA - MICROSCOPIC ONLY      Result Value Ref Range   WBC, Ur, HPF, POC 1-5     RBC, urine, microscopic 10-12     Bacteria, U Microscopic moderate     Mucus, UA negative     Epithelial cells, urine per micros mederate     Crystals, Ur, HPF, POC negative     Casts, Ur, LPF, POC negative     Yeast, UA negative    POCT URINALYSIS DIPSTICK      Result Value Ref Range   Color, UA gold     Clarity, UA clear     Glucose, UA negative     Bilirubin, UA  negative     Ketones, UA negative     Spec Grav, UA <=1.005     Blood, UA moderate     pH, UA 8.0     Protein, UA trace     Urobilinogen, UA negative     Nitrite, UA negative     Leukocytes, UA Negative    POCT URINE PREGNANCY      Result Value Ref Range   Preg Test, Ur Negative         Assessment & Plan:  1. Nausea alone  - POCT UA - Microscopic Only - POCT urinalysis dipstick - POCT urine pregnancy  2. UTI (urinary tract infection)  - nitrofurantoin, macrocrystal-monohydrate, (MACROBID) 100 MG capsule; Take 1 capsule (100 mg total) by mouth 2 (two) times daily.  Dispense: 20 capsule; Refill: 0 -Increase fluid intake AZO over the counter X2 days Frequent voiding Proper perineal hygiene RTO prn Patient verbalized understanding [name], FNP-C

## 2014-03-05 ENCOUNTER — Encounter: Payer: Self-pay | Admitting: *Deleted

## 2014-03-09 ENCOUNTER — Other Ambulatory Visit: Payer: Self-pay | Admitting: General Practice

## 2014-03-09 ENCOUNTER — Encounter: Payer: Self-pay | Admitting: General Practice

## 2014-03-09 ENCOUNTER — Ambulatory Visit (INDEPENDENT_AMBULATORY_CARE_PROVIDER_SITE_OTHER): Payer: Medicaid Other | Admitting: General Practice

## 2014-03-09 ENCOUNTER — Telehealth: Payer: Self-pay | Admitting: General Practice

## 2014-03-09 VITALS — BP 113/73 | HR 89 | Temp 98.1°F | Ht 63.0 in | Wt 134.2 lb

## 2014-03-09 DIAGNOSIS — Z0000 Encounter for general adult medical examination without abnormal findings: Secondary | ICD-10-CM

## 2014-03-09 DIAGNOSIS — M62838 Other muscle spasm: Secondary | ICD-10-CM

## 2014-03-09 DIAGNOSIS — Z124 Encounter for screening for malignant neoplasm of cervix: Secondary | ICD-10-CM

## 2014-03-09 DIAGNOSIS — IMO0001 Reserved for inherently not codable concepts without codable children: Secondary | ICD-10-CM

## 2014-03-09 MED ORDER — CYCLOBENZAPRINE HCL 10 MG PO TABS: 10.0000 mg | ORAL_TABLET | Freq: Every day | ORAL | Status: DC

## 2014-03-09 MED ORDER — NORGESTIM-ETH ESTRAD TRIPHASIC 0.18/0.215/0.25 MG-25 MCG PO TABS: ORAL_TABLET | ORAL | Status: DC

## 2014-03-09 MED ORDER — CYCLOBENZAPRINE HCL 5 MG PO TABS: 5.0000 mg | ORAL_TABLET | Freq: Every day | ORAL | Status: DC

## 2014-03-09 NOTE — Patient Instructions (Addendum)
Pap Test A Pap test is a procedure done in a clinic office to evaluate cells that are on the surface of the cervix. The cervix is the lower portion of the uterus and upper portion of the vagina. For some women, the cervical region has the potential to form cancer. With consistent evaluations by your caregiver, this type of cancer can be prevented.  If a Pap test is abnormal, it is most often a result of a previous exposure to human papillomavirus (HPV). HPV is a virus that can infect the cells of the cervix and cause dysplasia. Dysplasia is where the cells no longer look normal. If a woman has been diagnosed with high-grade or severe dysplasia, they are at higher risk of developing cervical cancer. People diagnosed with low-grade dysplasia should still be seen by their caregiver because there is a small chance that low-grade dysplasia could develop into cancer.  LET YOUR CAREGIVER KNOW ABOUT:  Recent sexually transmitted infection (STI) you have had.  Any new sex partners you have had.  History of previous abnormal Pap tests results.  History of previous cervical procedures you have had (colposcopy, biopsy, loop electrosurgical excision procedure [LEEP]).  Concerns you have had regarding unusual vaginal discharge.  History of pelvic pain.  Your use of birth control. BEFORE THE PROCEDURE  Ask your caregiver when to schedule your Pap test. It is best not to be on your period if your caregiver uses a wooden spatula to collect cells or applies cells to a glass slide. Newer techniques are not so sensitive to the timing of a menstrual cycle.  Do not douche or have sexual intercourse for 24 hours before the test.   Do not use vaginal creams or tampons for 24 hours before the test.   Empty your bladder just before the test to lessen any discomfort.  PROCEDURE You will lie on an exam table with your feet in stirrups. A warm metal or plastic instrument (speculum) is placed in your vagina. This  instrument allows your caregiver to see the inside of your vagina and look at your cervix. A small, plastic brush or wooden spatula is then used to collect cervical cells. These cells are placed in a lab specimen container. The cells are looked at under a microscope. A specialist will determine if the cells are normal.  AFTER THE PROCEDURE Make sure to get your test results.If your results come back abnormal, you may need further testing.  Document Released: 02/10/2003 Document Revised: 02/12/2012 Document Reviewed: 11/16/2011 [facility] Patient Information 2014 [place], [place].  Muscle Strain A muscle strain is an injury that occurs when a muscle is stretched beyond its normal length. Usually a small number of muscle fibers are torn when this happens. Muscle strain is rated in degrees. First-degree strains have the least amount of muscle fiber tearing and pain. Second-degree and third-degree strains have increasingly more tearing and pain.  Usually, recovery from muscle strain takes 1 2 weeks. Complete healing takes 5 6 weeks.  CAUSES  Muscle strain happens when a sudden, violent force placed on a muscle stretches it too far. This may occur with lifting, sports, or a fall.  RISK FACTORS Muscle strain is especially common in athletes.  SIGNS AND SYMPTOMS At the site of the muscle strain, there may be:  Pain.  Bruising.  Swelling.  Difficulty using the muscle due to pain or lack of normal function. DIAGNOSIS  Your health care provider will perform a physical exam and ask about your medical history.  TREATMENT  Often, the best treatment for a muscle strain is resting, icing, and applying cold compresses to the injured area.  HOME CARE INSTRUCTIONS   Use the PRICE method of treatment to promote muscle healing during the first 2 3 days after your injury. The PRICE method involves:  Protecting the muscle from being injured again.  Restricting your activity and resting the injured body  part.  Icing your injury. To do this, put ice in a plastic bag. Place a towel between your skin and the bag. Then, apply the ice and leave it on from 15 20 minutes each hour. After the third day, switch to moist heat packs.  Apply compression to the injured area with a splint or elastic bandage. Be careful not to wrap it too tightly. This may interfere with blood circulation or increase swelling.  Elevate the injured body part above the level of your heart as often as you can.  Only take over-the-counter or prescription medicines for pain, discomfort, or fever as directed by your health care provider.  Warming up prior to exercise helps to prevent future muscle strains. SEEK MEDICAL CARE IF:   You have increasing pain or swelling in the injured area.  You have numbness, tingling, or a significant loss of strength in the injured area. MAKE SURE YOU:   Understand these instructions.  Will watch your condition.  Will get help right away if you are not doing well or get worse. Document Released: 11/20/2005 Document Revised: 09/10/2013 Document Reviewed: 06/19/2013 [facility] Patient Information 2014 [place], [place].

## 2014-03-09 NOTE — Progress Notes (Signed)
   Subjective:    Patient ID: Rebekah [name], female    DOB: [birth year], 28 y.o.   MRN: [id number]  HPI Patient presents today for gyn visit. Taking birth control as directed. Last menstrual cycle was 02/25/14. Complains of muscle tenderness in lower back. Reports lifting and stocking in current place of employment and possible strained a muscle also muscle spasms.     Review of Systems  Constitutional: Negative for fever and chills.  Respiratory: Negative for chest tightness and shortness of breath.   Cardiovascular: Negative for chest pain and palpitations.  Genitourinary: Negative for dysuria, hematuria and difficulty urinating.  Musculoskeletal:       Muscle strain  Neurological: Negative for dizziness, weakness and headaches.       Objective:   Physical Exam  Constitutional: She is oriented to person, place, and time. She appears well-developed and well-nourished.  HENT:  Head: Normocephalic and atraumatic.  Right Ear: External ear normal.  Left Ear: External ear normal.  Mouth/Throat: Oropharynx is clear and moist.  Eyes: Conjunctivae and EOM are normal. Pupils are equal, round, and reactive to light.  Neck: Normal range of motion. Neck supple. No thyromegaly present.  Cardiovascular: Normal rate, regular rhythm, normal heart sounds and intact distal pulses.   Pulmonary/Chest: Effort normal and breath sounds normal. No respiratory distress. She exhibits no tenderness. Right breast exhibits no inverted nipple, no mass, no nipple discharge, no skin change and no tenderness. Left breast exhibits no inverted nipple, no mass, no nipple discharge, no skin change and no tenderness. Breasts are symmetrical.  Abdominal: Soft. Bowel sounds are normal. She exhibits no distension.  Genitourinary: Vagina normal and uterus normal. No breast swelling, tenderness, discharge or bleeding. No labial fusion. There is no rash, tenderness, lesion or injury on the right labia. There is no rash,  tenderness, lesion or injury on the left labia. Uterus is not deviated, not enlarged, not fixed and not tender. Cervix exhibits no motion tenderness, no discharge and no friability. Right adnexum displays no mass, no tenderness and no fullness. Left adnexum displays no mass, no tenderness and no fullness. No erythema, tenderness or bleeding around the vagina. No foreign body around the vagina. No signs of injury around the vagina. No vaginal discharge found.  Musculoskeletal: She exhibits tenderness.  Lumbar area tenderness and tightness upon palpation  Lymphadenopathy:    She has no cervical adenopathy.  Neurological: She is alert and oriented to person, place, and time.  Skin: Skin is warm and dry.  Psychiatric: She has a normal mood and affect.          Assessment & Plan:  1. Annual physical exam  - Pap IG w/ reflex to HPV when ASC-U  2. Muscle spasm  - cyclobenzaprine (FLEXERIL) 10 MG tablet; Take 1 tablet (10 mg total) by mouth at bedtime.  Dispense: 20 tablet; Refill: 0 -provided and discussed patient information RTO prn Patient verbalized understanding [name], FNP-C

## 2014-03-09 NOTE — Telephone Encounter (Signed)
Script sent to pharmacy.

## 2014-03-10 LAB — PAP IG W/ RFLX HPV ASCU: PAP Smear Comment: 0

## 2014-03-23 ENCOUNTER — Encounter: Payer: Self-pay | Admitting: *Deleted

## 2014-07-28 ENCOUNTER — Telehealth: Payer: Self-pay | Admitting: Family Medicine

## 2014-08-04 NOTE — Telephone Encounter (Signed)
Patient ended up going to urgent care that day

## 2016-02-18 ENCOUNTER — Encounter: Payer: Self-pay | Admitting: Family Medicine

## 2016-02-18 ENCOUNTER — Ambulatory Visit (INDEPENDENT_AMBULATORY_CARE_PROVIDER_SITE_OTHER): Payer: Medicaid Other | Admitting: Family Medicine

## 2016-02-18 ENCOUNTER — Encounter (INDEPENDENT_AMBULATORY_CARE_PROVIDER_SITE_OTHER): Payer: Self-pay

## 2016-02-18 ENCOUNTER — Telehealth: Payer: Self-pay | Admitting: Family Medicine

## 2016-02-18 VITALS — BP 124/74 | HR 89 | Temp 98.7°F | Ht 63.0 in | Wt 140.4 lb

## 2016-02-18 DIAGNOSIS — R21 Rash and other nonspecific skin eruption: Secondary | ICD-10-CM | POA: Insufficient documentation

## 2016-02-18 NOTE — Patient Instructions (Signed)
Great to meet you!  Come back in 1 year for a physical unless you need [place] sooner.

## 2016-02-18 NOTE — Progress Notes (Signed)
   HPI  Patient presents today ere to discuss rash and requests letter of support for her to keep her cat.  Patient explains that she is in low income housing, they require a letter of supportto state that her animals are therapeutic and Kandis Nab order to keep the animals. She has 2 cats and states that they help her with stress, she was previously treated with medications which she did not tolerate well and has coped well with the animals in her house.  Rash Patient explains she has a rash which shefirst saw her boyfriend 4 years ago. She states that it began on her feet,she had small red blisters they came in, whenever she burst the blisters she would have several smaller blisters develop around the area withtenderness Now she gets them on the skin around her nose and her palms They seem to increase whenever she has stressful events Her boyfriend now is getting them from her  PMH: Smoking status noted ROS: Per HPI  Objective: BP 124/74 mmHg  Pulse 89  Temp(Src) 98.7 F (37.1 C) (Oral)  Ht 5\' 3"  (1.6 m)  Wt 140 lb 6.4 oz (63.685 kg)  BMI 24.88 kg/m2 Gen: NAD, alert, cooperative with exam HEENT: NCAT, Erythema nd crusting around BL nares- no lesions seen CV: RRR, good S1/S2, no murmur Resp: CTABL, no wheezes, non-labored Ext: No edema, warm Neuro: Alert and oriented, No gross deficits Skin: R palm with small flesh colored to brown nodule on the palm, erythemetous crust around BL nares  Assessment and plan:  # Rash Likley molluscum contagiosum, given Uptodate handout Consider syphilis but I think its unlikley as she has had her baby since the onset and would have been tested during pregnancy   # anxiety Written letter of support for her to keep her cats I am glad she can deal with her anxiety without pharmacologic therapy.    Laroy Apple, MD [facility] 02/18/2016, 5:02 PM

## 2016-02-18 NOTE — Telephone Encounter (Signed)
Patient aware that she must be seen we have not seen her since 2015.

## 2016-06-09 ENCOUNTER — Telehealth: Payer: Self-pay | Admitting: Family Medicine

## 2016-06-09 NOTE — Telephone Encounter (Signed)
done

## 2016-11-14 ENCOUNTER — Encounter: Payer: Self-pay | Admitting: Family Medicine

## 2016-11-14 ENCOUNTER — Ambulatory Visit: Payer: Medicaid Other | Admitting: Family Medicine

## 2016-11-14 ENCOUNTER — Ambulatory Visit (INDEPENDENT_AMBULATORY_CARE_PROVIDER_SITE_OTHER): Payer: Medicaid Other | Admitting: Family Medicine

## 2016-11-14 VITALS — BP 119/84 | HR 64 | Temp 99.2°F | Ht 63.0 in | Wt 145.1 lb

## 2016-11-14 DIAGNOSIS — G8929 Other chronic pain: Secondary | ICD-10-CM

## 2016-11-14 DIAGNOSIS — R1031 Right lower quadrant pain: Secondary | ICD-10-CM | POA: Diagnosis not present

## 2016-11-14 DIAGNOSIS — R1013 Epigastric pain: Secondary | ICD-10-CM

## 2016-11-14 MED ORDER — TRAMADOL HCL 50 MG PO TABS: 50.0000 mg | ORAL_TABLET | Freq: Every day | ORAL | 0 refills | Status: DC | PRN

## 2016-11-14 MED ORDER — RANITIDINE HCL 150 MG PO TABS
150.0000 mg | ORAL_TABLET | Freq: Two times a day (BID) | ORAL | 1 refills | Status: DC
Start: 2016-11-14 — End: 2018-10-18

## 2016-11-14 NOTE — Progress Notes (Signed)
BP 119/84   Pulse 64   Temp 99.2 F (37.3 C) (Oral)   Ht 5\' 3"  (1.6 m)   Wt 145 lb 2 oz (65.8 kg)   LMP 11/12/2016 (Exact Date)   BMI 25.71 kg/m    Subjective:    Patient ID: Rebekah [name], female    DOB: 03-17-1986, 30 y.o.   MRN: [id number]  HPI: Rebekah [name] is a 30 y.o. female presenting on 11/14/2016 for Hospital followup (admitted to [facility] on Friday evening, discharged today, for abdominal pain - labs and scans were performed but nothing was found)   HPI Hospital follow-up Patient was in [facility] on 11/11/2016 for abdominal pain. Her abdominal pain developed the night before and then she had some vomiting and diarrhea starting on 12/9 through 12/10. She felt like the abdominal pain worsened over that day and then she had abdominal ultrasounds and a CT scan of the abdomen at [facility] which we have the records of which did not show any specific abnormalities to explain what could possibly be going on. She did have a mild amount of inflammation around the loops of small bowel. After the first 2 days her diarrhea resolved and her vomiting was relieved but she continued to have abdominal pain in the right lower quadrant for one more day after the initial 2 days in the hospital and then she started developing epigastric abdominal pain which is been present over the past couple days. Her right lower quadrant abdominal pain has improved but her epigastric abdominal pain has persisted since leaving the hospital. She still feels like she is not eating a whole lot but denies further nausea or vomiting. She has not had a bowel movement last 3 or 4 days since being in the hospital but does not deciliter to constipate her like she has to strain to have a bowel movement. She denies any belching or burping or acid reflux. She denies any urinary symptoms. She denies any vaginal symptoms currently but she was started on Flagyl prior to the Hospital visit for bacterial vaginosis and  she has restarted that today since being out of the hospital. Also the day prior to all of this starting was the day that she started her menstrual cycle and that ended just yesterday.  Relevant past medical, surgical, family and social history reviewed and updated as indicated. Interim medical history since our last visit reviewed. Allergies and medications reviewed and updated.  Review of Systems  Constitutional: Negative for chills and fever.  HENT: Negative for congestion, ear discharge and ear pain.   Eyes: Negative for redness and visual disturbance.  Respiratory: Negative for chest tightness and shortness of breath.   Cardiovascular: Negative for chest pain and leg swelling.  Gastrointestinal: Positive for abdominal pain, diarrhea and nausea. Negative for blood in stool, constipation and vomiting.  Genitourinary: Negative for difficulty urinating, dysuria, flank pain, frequency, urgency, vaginal bleeding, vaginal discharge and vaginal pain.  Musculoskeletal: Negative for back pain and gait problem.  Skin: Negative for rash.  Neurological: Negative for dizziness, light-headedness and headaches.  Psychiatric/Behavioral: Negative for agitation and behavioral problems.  All other systems reviewed and are negative.   Per HPI unless specifically indicated above      Objective:    BP 119/84   Pulse 64   Temp 99.2 F (37.3 C) (Oral)   Ht 5\' 3"  (1.6 m)   Wt 145 lb 2 oz (65.8 kg)   LMP 11/12/2016 (Exact Date)   BMI  25.71 kg/m   Wt Readings from Last 3 Encounters:  11/14/16 145 lb 2 oz (65.8 kg)  02/18/16 140 lb 6.4 oz (63.7 kg)  03/09/14 134 lb 3.2 oz (60.9 kg)    Physical Exam  Constitutional: She is oriented to person, place, and time. She appears well-developed and well-nourished. No distress.  Eyes: Conjunctivae are normal.  Cardiovascular: Normal rate, regular rhythm, normal heart sounds and intact distal pulses.   No murmur heard. Pulmonary/Chest: Effort normal and  breath sounds normal. No respiratory distress. She has no wheezes. She has no rales.  Abdominal: Soft. Bowel sounds are normal. She exhibits no distension. There is tenderness (Epigastric abdominal pain and right lower quadrant abdominal pain, no rebound or guarding, no CVA tenderness). There is no rebound and no guarding.  Musculoskeletal: Normal range of motion. She exhibits no edema or tenderness.  Neurological: She is alert and oriented to person, place, and time. Coordination normal.  Skin: Skin is warm and dry. No rash noted. She is not diaphoretic.  Psychiatric: She has a normal mood and affect. Her behavior is normal.  Nursing note and vitals reviewed.     Assessment & Plan:   Problem List Items Addressed This Visit    None    Visit Diagnoses    Epigastric abdominal pain    -  Primary   Likely GERD after hospital stay and not eating very well for a few days. Will give GI cocktail and Zantac   Relevant Medications   ranitidine (ZANTAC) 150 MG tablet   Other Relevant Orders   Ambulatory referral to Gastroenterology   Abdominal pain, chronic, right lower quadrant       CT and ultrasound abdomen were negative, possible ovarian cyst, has improved, we'll monitor   Relevant Orders   Ambulatory referral to Obstetrics / Gynecology       Follow up plan: Return if symptoms worsen or fail to improve.  Counseling provided for all of the vaccine components Orders Placed This Encounter  Procedures  . Ambulatory referral to Gastroenterology  . Ambulatory referral to Obstetrics / Gynecology    Caryl Pina, MD [facility] 11/14/2016, 3:15 PM

## 2016-11-21 ENCOUNTER — Encounter (INDEPENDENT_AMBULATORY_CARE_PROVIDER_SITE_OTHER): Payer: Self-pay | Admitting: Internal Medicine

## 2016-11-23 ENCOUNTER — Encounter: Payer: Medicaid Other | Admitting: Obstetrics and Gynecology

## 2016-11-23 ENCOUNTER — Encounter: Payer: Self-pay | Admitting: Obstetrics and Gynecology

## 2016-12-07 ENCOUNTER — Encounter (INDEPENDENT_AMBULATORY_CARE_PROVIDER_SITE_OTHER): Payer: Self-pay | Admitting: Internal Medicine

## 2016-12-07 ENCOUNTER — Ambulatory Visit (INDEPENDENT_AMBULATORY_CARE_PROVIDER_SITE_OTHER): Payer: Self-pay | Admitting: Internal Medicine

## 2018-01-22 DIAGNOSIS — F1910 Other psychoactive substance abuse, uncomplicated: Secondary | ICD-10-CM | POA: Insufficient documentation

## 2018-01-22 DIAGNOSIS — F1120 Opioid dependence, uncomplicated: Secondary | ICD-10-CM | POA: Insufficient documentation

## 2018-01-22 DIAGNOSIS — O99321 Drug use complicating pregnancy, first trimester: Secondary | ICD-10-CM | POA: Insufficient documentation

## 2018-05-02 DIAGNOSIS — R825 Elevated urine levels of drugs, medicaments and biological substances: Secondary | ICD-10-CM | POA: Insufficient documentation

## 2018-10-18 ENCOUNTER — Encounter: Payer: Self-pay | Admitting: Physician Assistant

## 2018-10-18 ENCOUNTER — Ambulatory Visit: Payer: Medicaid Other | Admitting: Physician Assistant

## 2018-10-18 VITALS — BP 108/70 | HR 86 | Temp 98.8°F | Ht 63.0 in | Wt 164.8 lb

## 2018-10-18 DIAGNOSIS — K047 Periapical abscess without sinus: Secondary | ICD-10-CM

## 2018-10-18 MED ORDER — PENICILLIN V POTASSIUM 500 MG PO TABS: 500.0000 mg | ORAL_TABLET | Freq: Three times a day (TID) | ORAL | 0 refills | Status: DC

## 2018-10-18 NOTE — Progress Notes (Signed)
BP 108/70   Pulse 86   Temp 98.8 F (37.1 C) (Oral)   Ht 5\' 3"  (1.6 m)   Wt 74.8 kg   BMI 29.19 kg/m    Subjective:    Patient ID: Rebekah [name], female    DOB: May 24, 1986, 32 y.o.   MRN: [id number]  HPI: Rebekah [name] is a 32 y.o. female presenting on 10/18/2018 for Facial Swelling (tooth broke last night and now face is swollen )   Patients tooth broke last night while eating and did not have any signifigant pain at that time. This morning she noticed facial swelling on the left maxillary sinus area. She also has moderate pain and is unable to eat on that side. She has been taking tylenol which has been helping the pain. The swelling is unchanged since the morning. Patient called her dentist and was able to get an appointment for 1 week. She is wondering if she can get antibiotic treatment until then. She denies any fevers, chills, ear pain, puss, chest pain, or sob.    Relevant past medical, surgical, family and social history reviewed and updated as indicated. Interim medical history since our last visit reviewed. Allergies and medications reviewed and updated.  Review of Systems  Constitutional: Negative for chills and fever.  HENT: Positive for dental problem (tooth broke, left side) and facial swelling (left buccal area). Negative for ear pain, mouth sores and trouble swallowing.   Eyes: Negative.   Respiratory: Negative for shortness of breath.   Cardiovascular: Negative for chest pain.  Gastrointestinal: Negative.   Musculoskeletal: Negative.   Neurological: Negative for headaches.    Per HPI unless specifically indicated above   Allergies as of 10/18/2018   No Known Allergies     Medication List        Accurate as of 10/18/18  4:09 PM. Always use your most recent med list.          medroxyPROGESTERone 150 MG/ML injection Commonly known as:  DEPO-PROVERA INJECT 1 ML (150 MG TOTAL) INTO THE MUSCLE EVERY THREE (3) MONTHS.   penicillin v potassium 500 MG  tablet Commonly known as:  VEETID Take 1 tablet (500 mg total) by mouth 3 (three) times daily.          Objective:    BP 108/70   Pulse 86   Temp 98.8 F (37.1 C) (Oral)   Ht 5\' 3"  (1.6 m)   Wt 74.8 kg   BMI 29.19 kg/m   Wt Readings from Last 3 Encounters:  10/18/18 74.8 kg  11/14/16 65.8 kg  02/18/16 63.7 kg    Physical Exam  Constitutional: She is oriented to person, place, and time. She appears well-developed and well-nourished. No distress.  HENT:  Head: Normocephalic.  Nose: Nose normal.  Mouth/Throat: Uvula is midline. Dental caries present. No dental abscesses. No tonsillar abscesses.    Buccal swelling left side  Eyes: Conjunctivae are normal.  Neck: Normal range of motion.  Cardiovascular: Normal rate, regular rhythm, normal heart sounds and intact distal pulses.  Pulmonary/Chest: Effort normal and breath sounds normal.  Neurological: She is alert and oriented to person, place, and time.  Skin: Skin is warm.  Psychiatric: She has a normal mood and affect.  Vitals reviewed.   Results for orders placed or performed in visit on 03/09/14  Pap IG w/ reflex to HPV when ASC-U  Result Value Ref Range   DIAGNOSIS: Comment (A)    Specimen adequacy: Comment  Clinician provided ICD9 Comment    Performed by: Comment    Electronically signed by: Comment    PAP Smear Comment .    Pathologist provided ICD9: Comment    Note: Comment    Test Methodology Comment    PAP Reflex Comment       Assessment & Plan:   Problem List Items Addressed This Visit    None    Visit Diagnoses    Dental abscess    -  Primary     Tooth avulsion Patients tooth broke last night while eating and today woke up with facial swelling and pain. She denies fever or chills. She has an appointment with the dentist in 1 week. I will prescribe Pen VK x 10 days. Return sooner if symptoms worsen.   Follow up plan: No follow-ups on file.  Counseling provided for all of the vaccine  components No orders of the defined types were placed in this encounter.   Particia Nearing, PA-C [facility] 10/18/2018, 4:09 PM

## 2018-10-28 ENCOUNTER — Telehealth: Payer: Self-pay | Admitting: Family Medicine

## 2018-10-28 ENCOUNTER — Other Ambulatory Visit: Payer: Self-pay | Admitting: Physician Assistant

## 2018-10-28 MED ORDER — PENICILLIN V POTASSIUM 500 MG PO TABS: 500.0000 mg | ORAL_TABLET | Freq: Three times a day (TID) | ORAL | 0 refills | Status: DC

## 2018-10-28 NOTE — Telephone Encounter (Signed)
Does pt ntbs or can rx be sent in? Please advise.

## 2018-10-28 NOTE — Telephone Encounter (Signed)
PT was not able to see dentist last week due to boyfriend being out of town and no one able to watch her newborn and is wanting to know if AJ could send in another rx of the penicillin v potassium (VEETID) 500 MG tablet she finished the first one she called in yesterday and doesn't want her tooth to get infected again.  Pt currently doesn't have an upcoming apt with dentist  Pharmacy: [facility]

## 2018-10-28 NOTE — Telephone Encounter (Signed)
sent 

## 2018-10-28 NOTE — Telephone Encounter (Signed)
Pt aware rx sent into pharmacy. 

## 2018-12-05 ENCOUNTER — Other Ambulatory Visit: Payer: Self-pay | Admitting: Physician Assistant

## 2018-12-05 ENCOUNTER — Telehealth: Payer: Self-pay | Admitting: Physician Assistant

## 2018-12-05 MED ORDER — PENICILLIN V POTASSIUM 500 MG PO TABS: 500.0000 mg | ORAL_TABLET | Freq: Three times a day (TID) | ORAL | 0 refills | Status: DC

## 2018-12-05 NOTE — Telephone Encounter (Signed)
Prescription refill has been sent to her pharmacy.

## 2018-12-06 NOTE — Telephone Encounter (Signed)
Left detailed message stating requested rx sent to pharmacy and to call back with any further questions.

## 2019-01-02 ENCOUNTER — Other Ambulatory Visit: Payer: Self-pay | Admitting: Physician Assistant

## 2019-01-02 ENCOUNTER — Telehealth: Payer: Self-pay | Admitting: Family Medicine

## 2019-01-02 MED ORDER — PENICILLIN V POTASSIUM 500 MG PO TABS: 500.0000 mg | ORAL_TABLET | Freq: Three times a day (TID) | ORAL | 0 refills | Status: DC

## 2019-01-02 NOTE — Telephone Encounter (Signed)
Was sent in 12/05/18- does patient need to be seen or can refill be sent?

## 2019-01-02 NOTE — Telephone Encounter (Signed)
Script sent  

## 2019-02-24 ENCOUNTER — Other Ambulatory Visit: Payer: Self-pay | Admitting: Family Medicine

## 2019-02-24 ENCOUNTER — Telehealth: Payer: Self-pay | Admitting: Physician Assistant

## 2019-02-24 MED ORDER — PENICILLIN V POTASSIUM 500 MG PO TABS: 500.0000 mg | ORAL_TABLET | Freq: Three times a day (TID) | ORAL | 0 refills | Status: DC

## 2019-02-24 NOTE — Telephone Encounter (Signed)
Patient states she has a Dental abcess.  Can not get in touch with the dentist, has an appt with Dental surgeon in June but needs an antibiotic

## 2019-02-24 NOTE — Telephone Encounter (Signed)
Refill sent.

## 2019-02-24 NOTE — Telephone Encounter (Signed)
Patient aware.

## 2019-12-15 LAB — HM PAP SMEAR: HM Pap smear: NEGATIVE

## 2020-03-02 ENCOUNTER — Encounter (HOSPITAL_COMMUNITY): Payer: Self-pay | Admitting: *Deleted

## 2020-03-02 ENCOUNTER — Other Ambulatory Visit: Payer: Self-pay

## 2020-03-02 ENCOUNTER — Emergency Department (HOSPITAL_COMMUNITY)
Admission: EM | Admit: 2020-03-02 | Discharge: 2020-03-02 | Disposition: A | Discharge status: Home / Self Care | Payer: Medicaid Other | Attending: Emergency Medicine | Admitting: Emergency Medicine

## 2020-03-02 ENCOUNTER — Emergency Department (HOSPITAL_COMMUNITY): Payer: Medicaid Other

## 2020-03-02 DIAGNOSIS — F17210 Nicotine dependence, cigarettes, uncomplicated: Secondary | ICD-10-CM | POA: Insufficient documentation

## 2020-03-02 DIAGNOSIS — R1030 Lower abdominal pain, unspecified: Secondary | ICD-10-CM | POA: Diagnosis not present

## 2020-03-02 DIAGNOSIS — Y9389 Activity, other specified: Secondary | ICD-10-CM | POA: Diagnosis not present

## 2020-03-02 DIAGNOSIS — Y92410 Unspecified street and highway as the place of occurrence of the external cause: Secondary | ICD-10-CM | POA: Diagnosis not present

## 2020-03-02 DIAGNOSIS — Z793 Long term (current) use of hormonal contraceptives: Secondary | ICD-10-CM | POA: Insufficient documentation

## 2020-03-02 DIAGNOSIS — Y999 Unspecified external cause status: Secondary | ICD-10-CM | POA: Diagnosis not present

## 2020-03-02 DIAGNOSIS — R109 Unspecified abdominal pain: Secondary | ICD-10-CM

## 2020-03-02 LAB — CBC WITH DIFFERENTIAL/PLATELET
Abs Immature Granulocytes: 0.04 10*3/uL (ref 0.00–0.07)
Basophils Absolute: 0.1 10*3/uL (ref 0.0–0.1)
Basophils Relative: 1 %
Eosinophils Absolute: 0.3 10*3/uL (ref 0.0–0.5)
Eosinophils Relative: 4 %
HCT: 41.1 % (ref 36.0–46.0)
Hemoglobin: 13.5 g/dL (ref 12.0–15.0)
Immature Granulocytes: 0 %
Lymphocytes Relative: 29 %
Lymphs Abs: 2.6 10*3/uL (ref 0.7–4.0)
MCH: 32.2 pg (ref 26.0–34.0)
MCHC: 32.8 g/dL (ref 30.0–36.0)
MCV: 98.1 fL (ref 80.0–100.0)
Monocytes Absolute: 0.7 10*3/uL (ref 0.1–1.0)
Monocytes Relative: 7 %
Neutro Abs: 5.3 10*3/uL (ref 1.7–7.7)
Neutrophils Relative %: 59 %
Platelets: 256 10*3/uL (ref 150–400)
RBC: 4.19 MIL/uL (ref 3.87–5.11)
RDW: 12.5 % (ref 11.5–15.5)
WBC: 9 10*3/uL (ref 4.0–10.5)
nRBC: 0 % (ref 0.0–0.2)

## 2020-03-02 LAB — COMPREHENSIVE METABOLIC PANEL
ALT: 19 U/L (ref 0–44)
AST: 16 U/L (ref 15–41)
Albumin: 4.1 g/dL (ref 3.5–5.0)
Alkaline Phosphatase: 65 U/L (ref 38–126)
Anion gap: 9 (ref 5–15)
BUN: 14 mg/dL (ref 6–20)
CO2: 26 mmol/L (ref 22–32)
Calcium: 8.7 mg/dL — ABNORMAL LOW (ref 8.9–10.3)
Chloride: 102 mmol/L (ref 98–111)
Creatinine, Ser: 0.85 mg/dL (ref 0.44–1.00)
GFR calc Af Amer: >60 mL/min (ref >60)
GFR calc non Af Amer: >60 mL/min (ref >60)
Glucose, Bld: 106 mg/dL — ABNORMAL HIGH (ref 70–99)
Potassium: 3.5 mmol/L (ref 3.5–5.1)
Sodium: 137 mmol/L (ref 135–145)
Total Bilirubin: 0.3 mg/dL (ref 0.3–1.2)
Total Protein: 6.9 g/dL (ref 6.5–8.1)

## 2020-03-02 LAB — HCG, QUANTITATIVE, PREGNANCY: hCG, Beta Chain, Quant, S: <1 m[IU]/mL (ref <5)

## 2020-03-02 MED ORDER — IOHEXOL 300 MG/ML  SOLN
100.0000 mL | Freq: Once | INTRAMUSCULAR | Status: AC | PRN
  Administered 2020-03-02: 100 mL via INTRAVENOUS

## 2020-03-02 MED ORDER — IBUPROFEN 800 MG PO TABS: 800.0000 mg | ORAL_TABLET | Freq: Three times a day (TID) | ORAL | 0 refills | Status: DC

## 2020-03-02 NOTE — Discharge Instructions (Addendum)
Your blood test and CT scan of your abdomen did not show any evidence of organ injury.  Take Tylenol or ibuprofen if needed for pain.  Follow-up with your primary doctor for recheck.  Return to emergency department for any worsening symptoms.
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
## 2020-03-16 ENCOUNTER — Encounter: Payer: Self-pay | Admitting: Family Medicine

## 2020-03-16 ENCOUNTER — Ambulatory Visit: Payer: Medicaid Other | Admitting: Family Medicine

## 2020-03-16 ENCOUNTER — Other Ambulatory Visit: Payer: Self-pay

## 2020-03-16 VITALS — BP 128/90 | HR 106 | Temp 98.2°F | Ht 63.0 in | Wt 182.0 lb

## 2020-03-16 DIAGNOSIS — R3129 Other microscopic hematuria: Secondary | ICD-10-CM

## 2020-03-16 DIAGNOSIS — S39012D Strain of muscle, fascia and tendon of lower back, subsequent encounter: Secondary | ICD-10-CM

## 2020-03-16 DIAGNOSIS — R109 Unspecified abdominal pain: Secondary | ICD-10-CM

## 2020-03-16 LAB — URINALYSIS
Bilirubin, UA: NEGATIVE
Glucose, UA: NEGATIVE
Leukocytes,UA: NEGATIVE
Nitrite, UA: NEGATIVE
Specific Gravity, UA: 1.025 (ref 1.005–1.030)
Urobilinogen, Ur: 0.2 mg/dL (ref 0.2–1.0)
pH, UA: 5.5 (ref 5.0–7.5)

## 2020-03-16 MED ORDER — PREDNISONE 20 MG PO TABS: ORAL_TABLET | ORAL | 0 refills | Status: DC

## 2020-03-16 MED ORDER — DICLOFENAC SODIUM 1 % EX GEL: 4.0000 g | Freq: Four times a day (QID) | CUTANEOUS | 1 refills | Status: DC

## 2020-03-16 NOTE — Patient Instructions (Signed)
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
## 2020-03-18 LAB — URINE CULTURE

## 2020-03-18 NOTE — Progress Notes (Signed)
Urine culture negative. Pt needs to recheck urine to make sure hematuria has cleared.

## 2020-03-24 ENCOUNTER — Ambulatory Visit: Payer: Medicaid Other | Admitting: Family Medicine

## 2020-03-24 ENCOUNTER — Telehealth: Payer: Self-pay | Admitting: Family Medicine

## 2020-03-24 NOTE — Telephone Encounter (Signed)
Patient has a follow up appointment scheduled. 

## 2020-04-07 ENCOUNTER — Encounter: Payer: Self-pay | Admitting: Family Medicine

## 2020-04-07 ENCOUNTER — Ambulatory Visit: Payer: Medicaid Other | Admitting: Family Medicine

## 2020-04-07 ENCOUNTER — Telehealth: Payer: Self-pay | Admitting: Family Medicine

## 2020-04-07 ENCOUNTER — Other Ambulatory Visit: Payer: Self-pay

## 2020-04-07 VITALS — BP 135/89 | HR 110 | Temp 98.1°F | Ht 63.0 in | Wt 193.6 lb

## 2020-04-07 DIAGNOSIS — R319 Hematuria, unspecified: Secondary | ICD-10-CM

## 2020-04-07 DIAGNOSIS — R3121 Asymptomatic microscopic hematuria: Secondary | ICD-10-CM | POA: Diagnosis not present

## 2020-04-07 DIAGNOSIS — R635 Abnormal weight gain: Secondary | ICD-10-CM | POA: Diagnosis not present

## 2020-04-07 DIAGNOSIS — E669 Obesity, unspecified: Secondary | ICD-10-CM

## 2020-04-07 DIAGNOSIS — S39012D Strain of muscle, fascia and tendon of lower back, subsequent encounter: Secondary | ICD-10-CM

## 2020-04-07 LAB — URINALYSIS, COMPLETE
Bilirubin, UA: NEGATIVE
Glucose, UA: NEGATIVE
Ketones, UA: NEGATIVE
Leukocytes,UA: NEGATIVE
Nitrite, UA: NEGATIVE
Protein,UA: NEGATIVE
Specific Gravity, UA: 1.025 (ref 1.005–1.030)
Urobilinogen, Ur: 0.2 mg/dL (ref 0.2–1.0)
pH, UA: 5.5 (ref 5.0–7.5)

## 2020-04-07 LAB — MICROSCOPIC EXAMINATION
Epithelial Cells (non renal): >10 /hpf — AB (ref 0–10)
Renal Epithel, UA: NONE SEEN /hpf

## 2020-04-07 LAB — BAYER DCA HB A1C WAIVED: HB A1C (BAYER DCA - WAIVED): 5 % (ref <7.0)

## 2020-04-07 MED ORDER — DICLOFENAC SODIUM 1 % EX GEL: 4.0000 g | Freq: Four times a day (QID) | CUTANEOUS | 1 refills | Status: DC

## 2020-04-07 NOTE — Telephone Encounter (Signed)
  Prescription Request  04/07/2020  What is the name of the medication or equipment? diclofenac Sodium (VOLTAREN) 1 % GEL  Have you contacted your pharmacy to request a refill? (if applicable) no had apt today  Which pharmacy would you like this sent to? [facility]   Patient notified that their request is being sent to the clinical staff for review and that they should receive a response within 2 business days.

## 2020-04-07 NOTE — Progress Notes (Signed)
 Assessment & Plan:  1. Asymptomatic microscopic hematuria - Patient still has blood in her urine today but is on her period and is not wearing a tampon. Repeat when she is no longer on her period.  Education provided on hematuria. - Urinalysis, Complete - Urinalysis, Complete; Future - Microscopic Examination  2. Rapid weight gain - CBC with Differential/Platelet - CMP14+EGFR - Thyroid Panel With TSH - Lipid panel - Bayer DCA Hb A1c Waived  3. Obesity (BMI 30.0-34.9) - CBC with Differential/Platelet - CMP14+EGFR - Thyroid Panel With TSH - Lipid panel - Bayer DCA Hb A1c Waived   Return if symptoms worsen or fail to improve.  Rebekah Joyce, MSN, APRN, FNP-C [facility]  Subjective:    Patient ID: Rebekah [name], female    DOB: 07/31/1986, 34 y.o.   MRN: [id number]  Patient Care Team: [name], Rebekah F, FNP as PCP - General (Family Medicine)   Chief Complaint:  Chief Complaint  Patient presents with  . re check hematuria  . Weight Gain    requesting lab work     HPI: Rebekah [name] is a 33 y.o. female presenting on 04/07/2020 for re check hematuria and Weight Gain (requesting lab work )  Patient is here for a recheck of her urine.  She had blood in her urine on her last urinalysis last month.  She did leave a urine specimen today already but then reports she is on her period and is not wearing a tampon.   New complaints: Patient is requesting lab work due to her weight gain.  Social history:  Relevant past medical, surgical, family and social history reviewed and updated as indicated. Interim medical history since our last visit reviewed.  Allergies and medications reviewed and updated.  DATA REVIEWED: CHART IN EPIC  ROS: Negative unless specifically indicated above in HPI.    Current Outpatient Medications:  .  acetaminophen (TYLENOL) 500 MG tablet, Take 500 mg by mouth every 6 (six) hours as needed for mild pain or moderate pain.,  Disp: , Rfl:  .  Biotin 10 MG TABS, Take 1 tablet by mouth daily., Disp: , Rfl:  .  buprenorphine (SUBUTEX) 8 MG SUBL SL tablet, Place 8 mg under the tongue daily., Disp: , Rfl:  .  Cyclobenzaprine HCl (FLEXERIL PO), Take 1 tablet by mouth in the morning, at noon, and at bedtime., Disp: , Rfl:  .  gabapentin (NEURONTIN) 300 MG capsule, Take 600-1,200 mg by mouth See admin instructions. Take 600mg by mouth 4 times daily and take 1200mg at bedtime, Disp: , Rfl:  .  medroxyPROGESTERone (DEPO-PROVERA) 150 MG/ML injection, Inject 150 mg into the muscle every 3 (three) months. , Disp: , Rfl: 4 .  methocarbamol (ROBAXIN) 500 MG tablet, Take 500 mg by mouth 4 (four) times daily., Disp: , Rfl:  .  diclofenac Sodium (VOLTAREN) 1 % GEL, Apply 4 g topically 4 (four) times daily., Disp: 350 g, Rfl: 1   No Known Allergies Past Medical History:  Diagnosis Date  . Hyperlipidemia     History reviewed. No pertinent surgical history.  Social History   Socioeconomic History  . Marital status: Legally Separated    Spouse name: Not on file  . Number of children: Not on file  . Years of education: Not on file  . Highest education level: Not on file  Occupational History  . Not on file  Tobacco Use  . Smoking status: Current Every Day Smoker    Packs/day:   0.25    Years: 10.00    Pack years: 2.50    Types: Cigarettes  . Smokeless tobacco: Never Used  Substance and Sexual Activity  . Alcohol use: Yes  . Drug use: No  . Sexual activity: Yes    Birth control/protection: Pill  Other Topics Concern  . Not on file  Social History Narrative  . Not on file   Social Determinants of Health   Financial Resource Strain:   . Difficulty of Paying Living Expenses:   Food Insecurity:   . Worried About Charity fundraiser in the Last Year:   . Arboriculturist in the Last Year:   Transportation Needs:   . Film/video editor (Medical):   Marland Kitchen Lack of Transportation (Non-Medical):   Physical Activity:   .  Days of Exercise per Week:   . Minutes of Exercise per Session:   Stress:   . Feeling of Stress :   Social Connections:   . Frequency of Communication with Friends and Family:   . Frequency of Social Gatherings with Friends and Family:   . Attends Religious Services:   . Active Member of Clubs or Organizations:   . Attends Archivist Meetings:   Marland Kitchen Marital Status:   Intimate Partner Violence:   . Fear of Current or Ex-Partner:   . Emotionally Abused:   Marland Kitchen Physically Abused:   . Sexually Abused:         Objective:    BP 135/89   Pulse (!) 110   Temp 98.1 F (36.7 C) (Temporal)   Ht 5' 3" (1.6 m)   Wt 193 lb 9.6 oz (87.8 kg)   SpO2 98%   BMI 34.29 kg/m   Wt Readings from Last 3 Encounters:  04/07/20 193 lb 9.6 oz (87.8 kg)  03/16/20 182 lb (82.6 kg)  03/02/20 178 lb (80.7 kg)    Physical Exam Vitals reviewed.  Constitutional:      General: She is not in acute distress.    Appearance: Normal appearance. She is obese. She is not ill-appearing, toxic-appearing or diaphoretic.  HENT:     Head: Normocephalic and atraumatic.  Eyes:     General: No scleral icterus.       Right eye: No discharge.        Left eye: No discharge.     Conjunctiva/sclera: Conjunctivae normal.  Cardiovascular:     Rate and Rhythm: Normal rate and regular rhythm.     Heart sounds: Normal heart sounds. No murmur. No friction rub. No gallop.   Pulmonary:     Effort: Pulmonary effort is normal. No respiratory distress.     Breath sounds: Normal breath sounds. No stridor. No wheezing, rhonchi or rales.  Musculoskeletal:        General: Normal range of motion.     Cervical back: Normal range of motion.  Skin:    General: Skin is warm and dry.     Capillary Refill: Capillary refill takes less than 2 seconds.  Neurological:     General: No focal deficit present.     Mental Status: She is alert and oriented to person, place, and time. Mental status is at baseline.  Psychiatric:         Mood and Affect: Mood normal.        Behavior: Behavior normal.        Thought Content: Thought content normal.        Judgment: Judgment normal.  Lab Results  Component Value Date   TSH 0.960 04/07/2020   Lab Results  Component Value Date   WBC 7.4 04/07/2020   HGB 14.5 04/07/2020   HCT 42.3 04/07/2020   MCV 95 04/07/2020   PLT 318 04/07/2020   Lab Results  Component Value Date   NA 142 04/07/2020   K 4.9 04/07/2020   CO2 20 04/07/2020   GLUCOSE 84 04/07/2020   BUN 14 04/07/2020   CREATININE 0.93 04/07/2020   BILITOT <0.2 04/07/2020   ALKPHOS 95 04/07/2020   AST 17 04/07/2020   ALT 25 04/07/2020   PROT 6.5 04/07/2020   ALBUMIN 4.5 04/07/2020   CALCIUM 9.1 04/07/2020   ANIONGAP 9 03/02/2020   Lab Results  Component Value Date   CHOL 204 (H) 04/07/2020   Lab Results  Component Value Date   HDL 60 04/07/2020   Lab Results  Component Value Date   LDLCALC 133 (H) 04/07/2020   Lab Results  Component Value Date   TRIG 58 04/07/2020   Lab Results  Component Value Date   CHOLHDL 3.4 04/07/2020   Lab Results  Component Value Date   HGBA1C 5.0 04/07/2020           

## 2020-04-07 NOTE — Patient Instructions (Signed)
Hematuria, Adult Hematuria is blood in the urine. Blood may be visible in the urine, or it may be identified with a test. This condition can be caused by infections of the bladder, urethra, kidney, or prostate. Other possible causes include:  Kidney stones.  Cancer of the urinary tract.  Too much calcium in the urine.  Conditions that are passed from parent to child (inherited conditions).  Exercise that requires a lot of energy. Infections can usually be treated with medicine, and a kidney stone usually will pass through your urine. If neither of these is the cause of your hematuria, more tests may be needed to identify the cause of your symptoms. It is very important to tell your health care provider about any blood in your urine, even if it is painless or the blood stops without treatment. Blood in the urine, when it happens and then stops and then happens again, can be a symptom of a very serious condition, including cancer. There is no pain in the initial stages of many urinary cancers. Follow these instructions at home: Medicines  Take over-the-counter and prescription medicines only as told by your health care provider.  If you were prescribed an antibiotic medicine, take it as told by your health care provider. Do not stop taking the antibiotic even if you start to feel better. Eating and drinking  Drink enough fluid to keep your urine clear or pale yellow. It is recommended that you drink 3-4 quarts (2.8-3.8 L) a day. If you have been diagnosed with an infection, it is recommended that you drink cranberry juice in addition to large amounts of water.  Avoid caffeine, tea, and carbonated beverages. These tend to irritate the bladder.  Avoid alcohol because it may irritate the prostate (men). General instructions  If you have been diagnosed with a kidney stone, follow your health care provider's instructions about straining your urine to catch the stone.  Empty your bladder  often. Avoid holding urine for long periods of time.  If you are female: ? After a bowel movement, wipe from front to back and use each piece of toilet paper only once. ? Empty your bladder before and after sex.  Pay attention to any changes in your symptoms. Tell your health care provider about any changes or any new symptoms.  It is your responsibility to get your test results. Ask your health care provider, or the department performing the test, when your results will be ready.  Keep all follow-up visits as told by your health care provider. This is important. Contact a health care provider if:  You develop back pain.  You have a fever.  You have nausea or vomiting.  Your symptoms do not improve after 3 days.  Your symptoms get worse. Get help right away if:  You develop severe vomiting and are unable take medicine without vomiting.  You develop severe pain in your back or abdomen even though you are taking medicine.  You pass a large amount of blood in your urine.  You pass blood clots in your urine.  You feel very weak or like you might faint.  You faint. Summary  Hematuria is blood in the urine. It has many possible causes.  It is very important that you tell your health care provider about any blood in your urine, even if it is painless or the blood stops without treatment.  Take over-the-counter and prescription medicines only as told by your health care provider.  Drink enough fluid to keep   your urine clear or pale yellow. This information is not intended to replace advice given to you by your health care provider. Make sure you discuss any questions you have with your health care provider. Document Revised: 04/16/2019 Document Reviewed: 12/23/2016 Elsevier Patient Education  2020 Elsevier Inc.  

## 2020-04-08 LAB — CMP14+EGFR
ALT: 25 IU/L (ref 0–32)
AST: 17 IU/L (ref 0–40)
Albumin/Globulin Ratio: 2.3 — ABNORMAL HIGH (ref 1.2–2.2)
Albumin: 4.5 g/dL (ref 3.8–4.8)
Alkaline Phosphatase: 95 IU/L (ref 39–117)
BUN/Creatinine Ratio: 15 (ref 9–23)
BUN: 14 mg/dL (ref 6–20)
Bilirubin Total: <0.2 mg/dL (ref 0.0–1.2)
CO2: 20 mmol/L (ref 20–29)
Calcium: 9.1 mg/dL (ref 8.7–10.2)
Chloride: 105 mmol/L (ref 96–106)
Creatinine, Ser: 0.93 mg/dL (ref 0.57–1.00)
GFR calc Af Amer: 93 mL/min/{1.73_m2} (ref >59)
GFR calc non Af Amer: 80 mL/min/{1.73_m2} (ref >59)
Globulin, Total: 2 g/dL (ref 1.5–4.5)
Glucose: 84 mg/dL (ref 65–99)
Potassium: 4.9 mmol/L (ref 3.5–5.2)
Sodium: 142 mmol/L (ref 134–144)
Total Protein: 6.5 g/dL (ref 6.0–8.5)

## 2020-04-08 LAB — CBC WITH DIFFERENTIAL/PLATELET
Basophils Absolute: 0.1 10*3/uL (ref 0.0–0.2)
Basos: 1 %
EOS (ABSOLUTE): 0.3 10*3/uL (ref 0.0–0.4)
Eos: 4 %
Hematocrit: 42.3 % (ref 34.0–46.6)
Hemoglobin: 14.5 g/dL (ref 11.1–15.9)
Immature Grans (Abs): 0.1 10*3/uL (ref 0.0–0.1)
Immature Granulocytes: 1 %
Lymphocytes Absolute: 2.2 10*3/uL (ref 0.7–3.1)
Lymphs: 29 %
MCH: 32.6 pg (ref 26.6–33.0)
MCHC: 34.3 g/dL (ref 31.5–35.7)
MCV: 95 fL (ref 79–97)
Monocytes Absolute: 0.7 10*3/uL (ref 0.1–0.9)
Monocytes: 9 %
Neutrophils Absolute: 4.1 10*3/uL (ref 1.4–7.0)
Neutrophils: 56 %
Platelets: 318 10*3/uL (ref 150–450)
RBC: 4.45 x10E6/uL (ref 3.77–5.28)
RDW: 12.5 % (ref 11.7–15.4)
WBC: 7.4 10*3/uL (ref 3.4–10.8)

## 2020-04-08 LAB — THYROID PANEL WITH TSH
Free Thyroxine Index: 1.5 (ref 1.2–4.9)
T3 Uptake Ratio: 26 % (ref 24–39)
T4, Total: 5.6 ug/dL (ref 4.5–12.0)
TSH: 0.96 u[IU]/mL (ref 0.450–4.500)

## 2020-04-08 LAB — LIPID PANEL
Chol/HDL Ratio: 3.4 ratio (ref 0.0–4.4)
Cholesterol, Total: 204 mg/dL — ABNORMAL HIGH (ref 100–199)
HDL: 60 mg/dL (ref >39)
LDL Chol Calc (NIH): 133 mg/dL — ABNORMAL HIGH (ref 0–99)
Triglycerides: 58 mg/dL (ref 0–149)
VLDL Cholesterol Cal: 11 mg/dL (ref 5–40)

## 2020-04-15 ENCOUNTER — Encounter: Payer: Self-pay | Admitting: Family Medicine

## 2020-04-15 DIAGNOSIS — E785 Hyperlipidemia, unspecified: Secondary | ICD-10-CM | POA: Insufficient documentation

## 2020-04-19 ENCOUNTER — Encounter: Payer: Self-pay | Admitting: Family Medicine

## 2020-04-29 ENCOUNTER — Telehealth (INDEPENDENT_AMBULATORY_CARE_PROVIDER_SITE_OTHER): Payer: Medicaid Other | Admitting: Family Medicine

## 2020-04-29 ENCOUNTER — Encounter: Payer: Self-pay | Admitting: Family Medicine

## 2020-04-29 DIAGNOSIS — H00014 Hordeolum externum left upper eyelid: Secondary | ICD-10-CM

## 2020-04-29 NOTE — Patient Instructions (Signed)

## 2020-04-29 NOTE — Progress Notes (Signed)
Virtual Visit via Telephone Note  I connected with Rebekah [name] on 04/29/20 at  11:46 AM by telephone and verified that I am speaking with the correct person using two identifiers. Rebekah [name] is currently located in her car and her child is currently with her during this visit. The provider, Loman Brooklyn, FNP is located in their home at time of visit.  I discussed the limitations, risks, security and privacy concerns of performing an evaluation and management service by telephone and the availability of in person appointments. I also discussed with the patient that there may be a patient responsible charge related to this service. The patient expressed understanding and agreed to proceed.  Subjective: PCP: Loman Brooklyn, FNP  Chief Complaint  Patient presents with  . Eye Problem   Patient reports she has a stye on her top left eyelid that is causing her pain.  She is requesting antibiotics to treat.   ROS: Per HPI  Current Outpatient Medications:  .  acetaminophen (TYLENOL) 500 MG tablet, Take 500 mg by mouth every 6 (six) hours as needed for mild pain or moderate pain., Disp: , Rfl:  .  Biotin 10 MG TABS, Take 1 tablet by mouth daily., Disp: , Rfl:  .  buprenorphine (SUBUTEX) 8 MG SUBL SL tablet, Place 8 mg under the tongue daily., Disp: , Rfl:  .  Cyclobenzaprine HCl (FLEXERIL PO), Take 1 tablet by mouth in the morning, at noon, and at bedtime., Disp: , Rfl:  .  diclofenac Sodium (VOLTAREN) 1 % GEL, Apply 4 g topically 4 (four) times daily., Disp: 350 g, Rfl: 1 .  gabapentin (NEURONTIN) 300 MG capsule, Take 600-1,200 mg by mouth See admin instructions. Take 600mg  by mouth 4 times daily and take 1200mg  at bedtime, Disp: , Rfl:  .  medroxyPROGESTERone (DEPO-PROVERA) 150 MG/ML injection, Inject 150 mg into the muscle every 3 (three) months. , Disp: , Rfl: 4 .  methocarbamol (ROBAXIN) 500 MG tablet, Take 500 mg by mouth 4 (four) times daily., Disp: , Rfl:   No Known  Allergies Past Medical History:  Diagnosis Date  . Hyperlipidemia     Observations/Objective: A&O  No respiratory distress or wheezing audible over the phone Mood, judgement, and thought processes all WNL   Assessment and Plan: 1. Hordeolum of left upper eyelid, unspecified hordeolum type - Encouraged use of warm compresses for 10 to 15 minutes 4-5 times a day.  Cleanse eye with Johnson's baby soap twice daily.  Advised if stye worsens she should seek treatment with an ophthalmologist.  Education provided on styes.   Follow Up Instructions:  I discussed the assessment and treatment plan with the patient. The patient was provided an opportunity to ask questions and all were answered. The patient agreed with the plan and demonstrated an understanding of the instructions.   The patient was advised to call back or seek an in-person evaluation if the symptoms worsen or if the condition fails to improve as anticipated.  The above assessment and management plan was discussed with the patient. The patient verbalized understanding of and has agreed to the management plan. Patient is aware to call the clinic if symptoms persist or worsen. Patient is aware when to return to the clinic for a follow-up visit. Patient educated on when it is appropriate to go to the emergency department.   Time call ended: 11:52 AM  I provided 8 minutes of non-face-to-face time during this encounter.  Hendricks Limes, MSN, APRN,  FNP-C [facility] 04/29/20

## 2020-07-04 DIAGNOSIS — Z419 Encounter for procedure for purposes other than remedying health state, unspecified: Secondary | ICD-10-CM | POA: Diagnosis not present

## 2020-07-05 DIAGNOSIS — Z7151 Drug abuse counseling and surveillance of drug abuser: Secondary | ICD-10-CM | POA: Diagnosis not present

## 2020-07-08 DIAGNOSIS — Z7151 Drug abuse counseling and surveillance of drug abuser: Secondary | ICD-10-CM | POA: Diagnosis not present

## 2020-07-13 DIAGNOSIS — Z3042 Encounter for surveillance of injectable contraceptive: Secondary | ICD-10-CM | POA: Diagnosis not present

## 2020-07-14 DIAGNOSIS — Z7151 Drug abuse counseling and surveillance of drug abuser: Secondary | ICD-10-CM | POA: Diagnosis not present

## 2020-07-15 DIAGNOSIS — Z7151 Drug abuse counseling and surveillance of drug abuser: Secondary | ICD-10-CM | POA: Diagnosis not present

## 2020-07-21 DIAGNOSIS — Z7151 Drug abuse counseling and surveillance of drug abuser: Secondary | ICD-10-CM | POA: Diagnosis not present

## 2020-07-22 DIAGNOSIS — Z7689 Persons encountering health services in other specified circumstances: Secondary | ICD-10-CM | POA: Diagnosis not present

## 2020-07-22 DIAGNOSIS — Z03818 Encounter for observation for suspected exposure to other biological agents ruled out: Secondary | ICD-10-CM | POA: Diagnosis not present

## 2020-07-29 DIAGNOSIS — Z7151 Drug abuse counseling and surveillance of drug abuser: Secondary | ICD-10-CM | POA: Diagnosis not present

## 2020-07-29 DIAGNOSIS — Z7689 Persons encountering health services in other specified circumstances: Secondary | ICD-10-CM | POA: Diagnosis not present

## 2020-08-04 DIAGNOSIS — Z419 Encounter for procedure for purposes other than remedying health state, unspecified: Secondary | ICD-10-CM | POA: Diagnosis not present

## 2020-08-04 DIAGNOSIS — Z7151 Drug abuse counseling and surveillance of drug abuser: Secondary | ICD-10-CM | POA: Diagnosis not present

## 2020-08-05 DIAGNOSIS — Z7689 Persons encountering health services in other specified circumstances: Secondary | ICD-10-CM | POA: Diagnosis not present

## 2020-08-05 DIAGNOSIS — Z7151 Drug abuse counseling and surveillance of drug abuser: Secondary | ICD-10-CM | POA: Diagnosis not present

## 2020-08-11 DIAGNOSIS — Z7151 Drug abuse counseling and surveillance of drug abuser: Secondary | ICD-10-CM | POA: Diagnosis not present

## 2020-08-17 DIAGNOSIS — Z03818 Encounter for observation for suspected exposure to other biological agents ruled out: Secondary | ICD-10-CM | POA: Diagnosis not present

## 2020-08-18 DIAGNOSIS — Z7151 Drug abuse counseling and surveillance of drug abuser: Secondary | ICD-10-CM | POA: Diagnosis not present

## 2020-08-19 DIAGNOSIS — Z7689 Persons encountering health services in other specified circumstances: Secondary | ICD-10-CM | POA: Diagnosis not present

## 2020-08-19 DIAGNOSIS — Z7151 Drug abuse counseling and surveillance of drug abuser: Secondary | ICD-10-CM | POA: Diagnosis not present

## 2020-08-24 DIAGNOSIS — Z03818 Encounter for observation for suspected exposure to other biological agents ruled out: Secondary | ICD-10-CM | POA: Diagnosis not present

## 2020-08-25 DIAGNOSIS — Z7151 Drug abuse counseling and surveillance of drug abuser: Secondary | ICD-10-CM | POA: Diagnosis not present

## 2020-08-26 DIAGNOSIS — Z7151 Drug abuse counseling and surveillance of drug abuser: Secondary | ICD-10-CM | POA: Diagnosis not present

## 2020-08-26 DIAGNOSIS — Z7689 Persons encountering health services in other specified circumstances: Secondary | ICD-10-CM | POA: Diagnosis not present

## 2020-08-27 ENCOUNTER — Ambulatory Visit: Payer: Medicaid Other | Admitting: Family

## 2020-08-31 DIAGNOSIS — Z03818 Encounter for observation for suspected exposure to other biological agents ruled out: Secondary | ICD-10-CM | POA: Diagnosis not present

## 2020-09-01 ENCOUNTER — Encounter: Payer: Self-pay | Admitting: Family Medicine

## 2020-09-01 DIAGNOSIS — Z7151 Drug abuse counseling and surveillance of drug abuser: Secondary | ICD-10-CM | POA: Diagnosis not present

## 2020-09-02 DIAGNOSIS — Z7689 Persons encountering health services in other specified circumstances: Secondary | ICD-10-CM | POA: Diagnosis not present

## 2020-09-02 DIAGNOSIS — Z7151 Drug abuse counseling and surveillance of drug abuser: Secondary | ICD-10-CM | POA: Diagnosis not present

## 2020-09-03 DIAGNOSIS — Z419 Encounter for procedure for purposes other than remedying health state, unspecified: Secondary | ICD-10-CM | POA: Diagnosis not present

## 2020-09-07 DIAGNOSIS — Z03818 Encounter for observation for suspected exposure to other biological agents ruled out: Secondary | ICD-10-CM | POA: Diagnosis not present

## 2020-09-09 DIAGNOSIS — Z7151 Drug abuse counseling and surveillance of drug abuser: Secondary | ICD-10-CM | POA: Diagnosis not present

## 2020-09-09 DIAGNOSIS — Z7689 Persons encountering health services in other specified circumstances: Secondary | ICD-10-CM | POA: Diagnosis not present

## 2020-09-14 DIAGNOSIS — Z03818 Encounter for observation for suspected exposure to other biological agents ruled out: Secondary | ICD-10-CM | POA: Diagnosis not present

## 2020-09-15 DIAGNOSIS — Z7151 Drug abuse counseling and surveillance of drug abuser: Secondary | ICD-10-CM | POA: Diagnosis not present

## 2020-09-16 DIAGNOSIS — Z7689 Persons encountering health services in other specified circumstances: Secondary | ICD-10-CM | POA: Diagnosis not present

## 2020-09-16 DIAGNOSIS — Z7151 Drug abuse counseling and surveillance of drug abuser: Secondary | ICD-10-CM | POA: Diagnosis not present

## 2020-09-21 DIAGNOSIS — Z03818 Encounter for observation for suspected exposure to other biological agents ruled out: Secondary | ICD-10-CM | POA: Diagnosis not present

## 2020-09-22 DIAGNOSIS — Z7151 Drug abuse counseling and surveillance of drug abuser: Secondary | ICD-10-CM | POA: Diagnosis not present

## 2020-09-29 DIAGNOSIS — Z7151 Drug abuse counseling and surveillance of drug abuser: Secondary | ICD-10-CM | POA: Diagnosis not present

## 2020-09-30 DIAGNOSIS — Z7151 Drug abuse counseling and surveillance of drug abuser: Secondary | ICD-10-CM | POA: Diagnosis not present

## 2020-10-01 DIAGNOSIS — Z7151 Drug abuse counseling and surveillance of drug abuser: Secondary | ICD-10-CM | POA: Diagnosis not present

## 2020-10-04 DIAGNOSIS — Z419 Encounter for procedure for purposes other than remedying health state, unspecified: Secondary | ICD-10-CM | POA: Diagnosis not present

## 2020-10-05 DIAGNOSIS — Z3042 Encounter for surveillance of injectable contraceptive: Secondary | ICD-10-CM | POA: Diagnosis not present

## 2020-10-06 DIAGNOSIS — Z7151 Drug abuse counseling and surveillance of drug abuser: Secondary | ICD-10-CM | POA: Diagnosis not present

## 2020-10-13 DIAGNOSIS — Z7151 Drug abuse counseling and surveillance of drug abuser: Secondary | ICD-10-CM | POA: Diagnosis not present

## 2020-10-20 DIAGNOSIS — Z7151 Drug abuse counseling and surveillance of drug abuser: Secondary | ICD-10-CM | POA: Diagnosis not present

## 2020-10-27 DIAGNOSIS — Z7151 Drug abuse counseling and surveillance of drug abuser: Secondary | ICD-10-CM | POA: Diagnosis not present

## 2020-10-28 DIAGNOSIS — Z7689 Persons encountering health services in other specified circumstances: Secondary | ICD-10-CM | POA: Diagnosis not present

## 2020-11-03 DIAGNOSIS — Z7151 Drug abuse counseling and surveillance of drug abuser: Secondary | ICD-10-CM | POA: Diagnosis not present

## 2020-11-03 DIAGNOSIS — Z419 Encounter for procedure for purposes other than remedying health state, unspecified: Secondary | ICD-10-CM | POA: Diagnosis not present

## 2020-11-04 DIAGNOSIS — Z7151 Drug abuse counseling and surveillance of drug abuser: Secondary | ICD-10-CM | POA: Diagnosis not present

## 2020-11-10 DIAGNOSIS — Z7151 Drug abuse counseling and surveillance of drug abuser: Secondary | ICD-10-CM | POA: Diagnosis not present

## 2020-11-11 DIAGNOSIS — Z7689 Persons encountering health services in other specified circumstances: Secondary | ICD-10-CM | POA: Diagnosis not present

## 2020-11-11 DIAGNOSIS — Z7151 Drug abuse counseling and surveillance of drug abuser: Secondary | ICD-10-CM | POA: Diagnosis not present

## 2020-11-18 DIAGNOSIS — Z7151 Drug abuse counseling and surveillance of drug abuser: Secondary | ICD-10-CM | POA: Diagnosis not present

## 2020-11-18 DIAGNOSIS — Z7689 Persons encountering health services in other specified circumstances: Secondary | ICD-10-CM | POA: Diagnosis not present

## 2020-11-24 DIAGNOSIS — Z7151 Drug abuse counseling and surveillance of drug abuser: Secondary | ICD-10-CM | POA: Diagnosis not present

## 2020-11-25 DIAGNOSIS — Z7689 Persons encountering health services in other specified circumstances: Secondary | ICD-10-CM | POA: Diagnosis not present

## 2020-11-26 DIAGNOSIS — Z7151 Drug abuse counseling and surveillance of drug abuser: Secondary | ICD-10-CM | POA: Diagnosis not present

## 2020-12-01 DIAGNOSIS — Z7151 Drug abuse counseling and surveillance of drug abuser: Secondary | ICD-10-CM | POA: Diagnosis not present

## 2020-12-02 DIAGNOSIS — Z7689 Persons encountering health services in other specified circumstances: Secondary | ICD-10-CM | POA: Diagnosis not present

## 2020-12-04 DIAGNOSIS — Z419 Encounter for procedure for purposes other than remedying health state, unspecified: Secondary | ICD-10-CM | POA: Diagnosis not present

## 2020-12-09 DIAGNOSIS — Z7151 Drug abuse counseling and surveillance of drug abuser: Secondary | ICD-10-CM | POA: Diagnosis not present

## 2020-12-09 DIAGNOSIS — Z7689 Persons encountering health services in other specified circumstances: Secondary | ICD-10-CM | POA: Diagnosis not present

## 2020-12-10 ENCOUNTER — Ambulatory Visit: Payer: Medicaid Other | Admitting: Family Medicine

## 2020-12-15 DIAGNOSIS — Z7151 Drug abuse counseling and surveillance of drug abuser: Secondary | ICD-10-CM | POA: Diagnosis not present

## 2020-12-16 DIAGNOSIS — Z7689 Persons encountering health services in other specified circumstances: Secondary | ICD-10-CM | POA: Diagnosis not present

## 2020-12-16 DIAGNOSIS — Z7151 Drug abuse counseling and surveillance of drug abuser: Secondary | ICD-10-CM | POA: Diagnosis not present

## 2020-12-22 DIAGNOSIS — Z7151 Drug abuse counseling and surveillance of drug abuser: Secondary | ICD-10-CM | POA: Diagnosis not present

## 2020-12-23 DIAGNOSIS — Z3042 Encounter for surveillance of injectable contraceptive: Secondary | ICD-10-CM | POA: Diagnosis not present

## 2020-12-23 DIAGNOSIS — Z03818 Encounter for observation for suspected exposure to other biological agents ruled out: Secondary | ICD-10-CM | POA: Diagnosis not present

## 2020-12-23 DIAGNOSIS — Z7689 Persons encountering health services in other specified circumstances: Secondary | ICD-10-CM | POA: Diagnosis not present

## 2020-12-24 DIAGNOSIS — Z7151 Drug abuse counseling and surveillance of drug abuser: Secondary | ICD-10-CM | POA: Diagnosis not present

## 2020-12-28 DIAGNOSIS — Z7151 Drug abuse counseling and surveillance of drug abuser: Secondary | ICD-10-CM | POA: Diagnosis not present

## 2020-12-29 DIAGNOSIS — Z7151 Drug abuse counseling and surveillance of drug abuser: Secondary | ICD-10-CM | POA: Diagnosis not present

## 2020-12-30 DIAGNOSIS — Z7689 Persons encountering health services in other specified circumstances: Secondary | ICD-10-CM | POA: Diagnosis not present

## 2021-01-04 DIAGNOSIS — Z419 Encounter for procedure for purposes other than remedying health state, unspecified: Secondary | ICD-10-CM | POA: Diagnosis not present

## 2021-01-06 DIAGNOSIS — Z7151 Drug abuse counseling and surveillance of drug abuser: Secondary | ICD-10-CM | POA: Diagnosis not present

## 2021-01-06 DIAGNOSIS — Z7689 Persons encountering health services in other specified circumstances: Secondary | ICD-10-CM | POA: Diagnosis not present

## 2021-01-10 ENCOUNTER — Encounter: Payer: Self-pay | Admitting: Family

## 2021-01-10 ENCOUNTER — Other Ambulatory Visit: Payer: Self-pay

## 2021-01-10 ENCOUNTER — Ambulatory Visit (INDEPENDENT_AMBULATORY_CARE_PROVIDER_SITE_OTHER): Payer: Medicaid Other | Admitting: Family

## 2021-01-10 VITALS — BP 118/77 | HR 83 | Temp 98.1°F | Ht 63.0 in | Wt 205.2 lb

## 2021-01-10 DIAGNOSIS — F419 Anxiety disorder, unspecified: Secondary | ICD-10-CM | POA: Diagnosis not present

## 2021-01-10 DIAGNOSIS — D1723 Benign lipomatous neoplasm of skin and subcutaneous tissue of right leg: Secondary | ICD-10-CM | POA: Diagnosis not present

## 2021-01-10 NOTE — Patient Instructions (Signed)
Lipoma  A lipoma is a noncancerous (benign) tumor that is made up of fat cells. This is a very common type of soft-tissue growth. Lipomas are usually found under the skin (subcutaneous). They may occur in any tissue of the body that contains fat. Common areas for lipomas to appear include the back, arms, shoulders, buttocks, and thighs. Lipomas grow slowly, and they are usually painless. Most lipomas do not cause problems and do not require treatment. What are the causes? The cause of this condition is not known. What increases the risk? You are more likely to develop this condition if:  You are 40-60 years old.  You have a family history of lipomas. What are the signs or symptoms? A lipoma usually appears as a small, round bump under the skin. In most cases, the lump will:  Feel soft or rubbery.  Not cause pain or other symptoms. However, if a lipoma is located in an area where it pushes on nerves, it can become painful or cause other symptoms. How is this diagnosed? A lipoma can usually be diagnosed with a physical exam. You may also have tests to confirm the diagnosis and to rule out other conditions. Tests may include:  Imaging tests, such as a CT scan or an MRI.  Removal of a tissue sample to be looked at under a microscope (biopsy). How is this treated? Treatment for this condition depends on the size of the lipoma and whether it is causing any symptoms.  For small lipomas that are not causing problems, no treatment is needed.  If a lipoma is bigger or it causes problems, surgery may be done to remove the lipoma. Lipomas can also be removed to improve appearance. Most often, the procedure is done after applying a medicine that numbs the area (local anesthetic).  Liposuction may be done to reduce the size of the lipoma before it is removed through surgery, or it may be done to remove the lipoma. Lipomas are removed with this method in order to limit incision size and scarring. A  liposuction tube is inserted through a small incision into the lipoma, and the contents of the lipoma are removed through the tube with suction. Follow these instructions at home:  Watch your lipoma for any changes.  Keep all follow-up visits as told by your health care provider. This is important. Contact a health care provider if:  Your lipoma becomes larger or hard.  Your lipoma becomes painful, red, or increasingly swollen. These could be signs of infection or a more serious condition. Get help right away if:  You develop tingling or numbness in an area near the lipoma. This could indicate that the lipoma is causing nerve damage. Summary  A lipoma is a noncancerous tumor that is made up of fat cells.  Most lipomas do not cause problems and do not require treatment.  If a lipoma is bigger or it causes problems, surgery may be done to remove the lipoma.  Contact a health care provider if your lipoma becomes larger or hard, or if it becomes painful, red, or increasingly swollen. Pain, redness, and swelling could be signs of infection or a more serious condition. This information is not intended to replace advice given to you by your health care provider. Make sure you discuss any questions you have with your health care provider. Document Revised: 07/07/2019 Document Reviewed: 07/07/2019 Elsevier Patient Education  2021 Elsevier Inc.  

## 2021-01-10 NOTE — Progress Notes (Signed)
   Subjective:    Patient ID: Rebekah [name], female    DOB: Feb 21, 1986, 35 y.o.   MRN: [id number]  Chief Complaint  Patient presents with  . Mass    Left upper outer thigh    HPI PT presents to the office today with a mass in her right thigh. She reports she noticed this about 5 years ago, however, it seems like it has become larger. She states she has gained about 60 lbs over the last year.   Denies any pain, fever, redness, or tenderness.    Review of Systems  All other systems reviewed and are negative.      Objective:   Physical Exam Vitals reviewed.  Constitutional:      General: She is not in acute distress.    Appearance: She is well-developed and well-nourished.  HENT:     Mouth/Throat:     Mouth: Oropharynx is clear and moist.  Eyes:     Pupils: Pupils are equal, round, and reactive to light.  Neck:     Thyroid: No thyromegaly.  Cardiovascular:     Rate and Rhythm: Normal rate and regular rhythm.     Pulses: Intact distal pulses.     Heart sounds: Normal heart sounds. No murmur heard.   Pulmonary:     Effort: Pulmonary effort is normal. No respiratory distress.     Breath sounds: Normal breath sounds. No wheezing.  Abdominal:     General: Bowel sounds are normal. There is no distension.     Palpations: Abdomen is soft.     Tenderness: There is no abdominal tenderness.  Musculoskeletal:        General: No tenderness or edema. Normal range of motion.     Cervical back: Normal range of motion and neck supple.  Skin:    General: Skin is warm and dry.     Comments: Small mass in right thigh that is apporx 1X1 cm  Neurological:     Mental Status: She is alert and oriented to person, place, and time.     Cranial Nerves: No cranial nerve deficit.     Deep Tendon Reflexes: Reflexes are normal and symmetric.  Psychiatric:        Mood and Affect: Mood and affect normal.        Behavior: Behavior normal.        Thought Content: Thought content normal.         Judgment: Judgment normal.          BP 118/77   Pulse 83   Temp 98.1 F (36.7 C)   Ht 5\' 3"  (1.6 m)   Wt 205 lb 3.2 oz (93.1 kg)   SpO2 98%   BMI 36.35 kg/m   Assessment & Plan:  Rebekah [name] comes in today with chief complaint of Mass (Left upper outer thigh)   Diagnosis and orders addressed:  1. Lipoma of right lower extremity - US RT LOWER EXTREM LTD SOFT TISSUE NON VASCULAR; Future  2. Anxiousness   Will order [place] per patient's request. She is very nervous about this area as she has used Interior and spatial designer and states it may be cancer.  Do not pick or squeeze  Evelina Dun, FNP

## 2021-01-11 DIAGNOSIS — Z7151 Drug abuse counseling and surveillance of drug abuser: Secondary | ICD-10-CM | POA: Diagnosis not present

## 2021-01-13 DIAGNOSIS — Z7689 Persons encountering health services in other specified circumstances: Secondary | ICD-10-CM | POA: Diagnosis not present

## 2021-01-13 DIAGNOSIS — Z7151 Drug abuse counseling and surveillance of drug abuser: Secondary | ICD-10-CM | POA: Diagnosis not present

## 2021-01-19 DIAGNOSIS — Z7151 Drug abuse counseling and surveillance of drug abuser: Secondary | ICD-10-CM | POA: Diagnosis not present

## 2021-01-20 DIAGNOSIS — Z7689 Persons encountering health services in other specified circumstances: Secondary | ICD-10-CM | POA: Diagnosis not present

## 2021-01-20 DIAGNOSIS — Z03818 Encounter for observation for suspected exposure to other biological agents ruled out: Secondary | ICD-10-CM | POA: Diagnosis not present

## 2021-01-26 DIAGNOSIS — Z7151 Drug abuse counseling and surveillance of drug abuser: Secondary | ICD-10-CM | POA: Diagnosis not present

## 2021-01-27 DIAGNOSIS — Z7689 Persons encountering health services in other specified circumstances: Secondary | ICD-10-CM | POA: Diagnosis not present

## 2021-01-28 DIAGNOSIS — Z7151 Drug abuse counseling and surveillance of drug abuser: Secondary | ICD-10-CM | POA: Diagnosis not present

## 2021-01-31 DIAGNOSIS — D1723 Benign lipomatous neoplasm of skin and subcutaneous tissue of right leg: Secondary | ICD-10-CM | POA: Diagnosis not present

## 2021-01-31 DIAGNOSIS — R2241 Localized swelling, mass and lump, right lower limb: Secondary | ICD-10-CM | POA: Diagnosis not present

## 2021-02-01 DIAGNOSIS — Z419 Encounter for procedure for purposes other than remedying health state, unspecified: Secondary | ICD-10-CM | POA: Diagnosis not present

## 2021-02-02 DIAGNOSIS — Z7151 Drug abuse counseling and surveillance of drug abuser: Secondary | ICD-10-CM | POA: Diagnosis not present

## 2021-02-03 DIAGNOSIS — Z7689 Persons encountering health services in other specified circumstances: Secondary | ICD-10-CM | POA: Diagnosis not present

## 2021-02-03 DIAGNOSIS — Z7151 Drug abuse counseling and surveillance of drug abuser: Secondary | ICD-10-CM | POA: Diagnosis not present

## 2021-02-09 DIAGNOSIS — Z7151 Drug abuse counseling and surveillance of drug abuser: Secondary | ICD-10-CM | POA: Diagnosis not present

## 2021-02-10 DIAGNOSIS — Z7689 Persons encountering health services in other specified circumstances: Secondary | ICD-10-CM | POA: Diagnosis not present

## 2021-02-15 ENCOUNTER — Telehealth: Payer: Self-pay | Admitting: Family Medicine

## 2021-02-15 NOTE — Telephone Encounter (Signed)
I am routing to you since you saw her for the concern.

## 2021-02-16 DIAGNOSIS — Z7151 Drug abuse counseling and surveillance of drug abuser: Secondary | ICD-10-CM | POA: Diagnosis not present

## 2021-02-17 ENCOUNTER — Telehealth: Payer: Self-pay

## 2021-02-17 DIAGNOSIS — Z03818 Encounter for observation for suspected exposure to other biological agents ruled out: Secondary | ICD-10-CM | POA: Diagnosis not present

## 2021-02-17 NOTE — Telephone Encounter (Signed)
Pt aware of [place] report from [facility]

## 2021-02-17 NOTE — Telephone Encounter (Signed)
Left detailed message on patient's voice mail.

## 2021-02-17 NOTE — Telephone Encounter (Signed)
[place] states most consistent with a benign lipoma. No follow up needed.

## 2021-02-18 DIAGNOSIS — Z7151 Drug abuse counseling and surveillance of drug abuser: Secondary | ICD-10-CM | POA: Diagnosis not present

## 2021-02-24 DIAGNOSIS — Z7151 Drug abuse counseling and surveillance of drug abuser: Secondary | ICD-10-CM | POA: Diagnosis not present

## 2021-02-28 DIAGNOSIS — Z7151 Drug abuse counseling and surveillance of drug abuser: Secondary | ICD-10-CM | POA: Diagnosis not present

## 2021-03-02 DIAGNOSIS — Z7151 Drug abuse counseling and surveillance of drug abuser: Secondary | ICD-10-CM | POA: Diagnosis not present

## 2021-03-03 DIAGNOSIS — Z7689 Persons encountering health services in other specified circumstances: Secondary | ICD-10-CM | POA: Diagnosis not present

## 2021-03-03 DIAGNOSIS — Z7151 Drug abuse counseling and surveillance of drug abuser: Secondary | ICD-10-CM | POA: Diagnosis not present

## 2021-03-04 DIAGNOSIS — Z419 Encounter for procedure for purposes other than remedying health state, unspecified: Secondary | ICD-10-CM | POA: Diagnosis not present

## 2021-03-08 DIAGNOSIS — Z7151 Drug abuse counseling and surveillance of drug abuser: Secondary | ICD-10-CM | POA: Diagnosis not present

## 2021-03-10 DIAGNOSIS — Z3042 Encounter for surveillance of injectable contraceptive: Secondary | ICD-10-CM | POA: Diagnosis not present

## 2021-03-10 DIAGNOSIS — Z7689 Persons encountering health services in other specified circumstances: Secondary | ICD-10-CM | POA: Diagnosis not present

## 2021-03-10 DIAGNOSIS — Z7151 Drug abuse counseling and surveillance of drug abuser: Secondary | ICD-10-CM | POA: Diagnosis not present

## 2021-03-14 DIAGNOSIS — Z7151 Drug abuse counseling and surveillance of drug abuser: Secondary | ICD-10-CM | POA: Diagnosis not present

## 2021-03-15 DIAGNOSIS — Z03818 Encounter for observation for suspected exposure to other biological agents ruled out: Secondary | ICD-10-CM | POA: Diagnosis not present

## 2021-03-15 DIAGNOSIS — Z7689 Persons encountering health services in other specified circumstances: Secondary | ICD-10-CM | POA: Diagnosis not present

## 2021-03-16 DIAGNOSIS — Z7689 Persons encountering health services in other specified circumstances: Secondary | ICD-10-CM | POA: Diagnosis not present

## 2021-03-16 DIAGNOSIS — Z7151 Drug abuse counseling and surveillance of drug abuser: Secondary | ICD-10-CM | POA: Diagnosis not present

## 2021-03-23 DIAGNOSIS — Z7689 Persons encountering health services in other specified circumstances: Secondary | ICD-10-CM | POA: Diagnosis not present

## 2021-03-23 DIAGNOSIS — Z7151 Drug abuse counseling and surveillance of drug abuser: Secondary | ICD-10-CM | POA: Diagnosis not present

## 2021-03-24 DIAGNOSIS — Z7151 Drug abuse counseling and surveillance of drug abuser: Secondary | ICD-10-CM | POA: Diagnosis not present

## 2021-03-24 DIAGNOSIS — Z7689 Persons encountering health services in other specified circumstances: Secondary | ICD-10-CM | POA: Diagnosis not present

## 2021-03-28 DIAGNOSIS — Z7151 Drug abuse counseling and surveillance of drug abuser: Secondary | ICD-10-CM | POA: Diagnosis not present

## 2021-03-30 DIAGNOSIS — Z7151 Drug abuse counseling and surveillance of drug abuser: Secondary | ICD-10-CM | POA: Diagnosis not present

## 2021-03-30 DIAGNOSIS — Z7689 Persons encountering health services in other specified circumstances: Secondary | ICD-10-CM | POA: Diagnosis not present

## 2021-03-31 DIAGNOSIS — Z7151 Drug abuse counseling and surveillance of drug abuser: Secondary | ICD-10-CM | POA: Diagnosis not present

## 2021-04-03 DIAGNOSIS — Z419 Encounter for procedure for purposes other than remedying health state, unspecified: Secondary | ICD-10-CM | POA: Diagnosis not present

## 2021-04-05 ENCOUNTER — Encounter: Payer: Self-pay | Admitting: Family Medicine

## 2021-04-06 DIAGNOSIS — Z7151 Drug abuse counseling and surveillance of drug abuser: Secondary | ICD-10-CM | POA: Diagnosis not present

## 2021-04-06 DIAGNOSIS — Z7689 Persons encountering health services in other specified circumstances: Secondary | ICD-10-CM | POA: Diagnosis not present

## 2021-04-07 DIAGNOSIS — Z7689 Persons encountering health services in other specified circumstances: Secondary | ICD-10-CM | POA: Diagnosis not present

## 2021-04-07 DIAGNOSIS — Z7151 Drug abuse counseling and surveillance of drug abuser: Secondary | ICD-10-CM | POA: Diagnosis not present

## 2021-04-11 DIAGNOSIS — Z7151 Drug abuse counseling and surveillance of drug abuser: Secondary | ICD-10-CM | POA: Diagnosis not present

## 2021-04-13 DIAGNOSIS — Z7689 Persons encountering health services in other specified circumstances: Secondary | ICD-10-CM | POA: Diagnosis not present

## 2021-04-13 DIAGNOSIS — H5213 Myopia, bilateral: Secondary | ICD-10-CM | POA: Diagnosis not present

## 2021-04-13 DIAGNOSIS — Z7151 Drug abuse counseling and surveillance of drug abuser: Secondary | ICD-10-CM | POA: Diagnosis not present

## 2021-04-14 DIAGNOSIS — Z03818 Encounter for observation for suspected exposure to other biological agents ruled out: Secondary | ICD-10-CM | POA: Diagnosis not present

## 2021-04-14 DIAGNOSIS — Z7689 Persons encountering health services in other specified circumstances: Secondary | ICD-10-CM | POA: Diagnosis not present

## 2021-04-20 DIAGNOSIS — Z7151 Drug abuse counseling and surveillance of drug abuser: Secondary | ICD-10-CM | POA: Diagnosis not present

## 2021-04-20 DIAGNOSIS — Z7689 Persons encountering health services in other specified circumstances: Secondary | ICD-10-CM | POA: Diagnosis not present

## 2021-04-21 DIAGNOSIS — Z7151 Drug abuse counseling and surveillance of drug abuser: Secondary | ICD-10-CM | POA: Diagnosis not present

## 2021-04-21 DIAGNOSIS — Z7689 Persons encountering health services in other specified circumstances: Secondary | ICD-10-CM | POA: Diagnosis not present

## 2021-04-27 DIAGNOSIS — Z7151 Drug abuse counseling and surveillance of drug abuser: Secondary | ICD-10-CM | POA: Diagnosis not present

## 2021-04-27 DIAGNOSIS — Z7689 Persons encountering health services in other specified circumstances: Secondary | ICD-10-CM | POA: Diagnosis not present

## 2021-04-28 DIAGNOSIS — Z7151 Drug abuse counseling and surveillance of drug abuser: Secondary | ICD-10-CM | POA: Diagnosis not present

## 2021-04-28 DIAGNOSIS — Z7689 Persons encountering health services in other specified circumstances: Secondary | ICD-10-CM | POA: Diagnosis not present

## 2021-05-04 DIAGNOSIS — Z419 Encounter for procedure for purposes other than remedying health state, unspecified: Secondary | ICD-10-CM | POA: Diagnosis not present

## 2021-05-04 DIAGNOSIS — Z7151 Drug abuse counseling and surveillance of drug abuser: Secondary | ICD-10-CM | POA: Diagnosis not present

## 2021-05-04 DIAGNOSIS — Z7689 Persons encountering health services in other specified circumstances: Secondary | ICD-10-CM | POA: Diagnosis not present

## 2021-05-05 DIAGNOSIS — Z7689 Persons encountering health services in other specified circumstances: Secondary | ICD-10-CM | POA: Diagnosis not present

## 2021-05-05 DIAGNOSIS — Z7151 Drug abuse counseling and surveillance of drug abuser: Secondary | ICD-10-CM | POA: Diagnosis not present

## 2021-05-11 DIAGNOSIS — Z7151 Drug abuse counseling and surveillance of drug abuser: Secondary | ICD-10-CM | POA: Diagnosis not present

## 2021-05-11 DIAGNOSIS — Z7689 Persons encountering health services in other specified circumstances: Secondary | ICD-10-CM | POA: Diagnosis not present

## 2021-05-12 DIAGNOSIS — Z03818 Encounter for observation for suspected exposure to other biological agents ruled out: Secondary | ICD-10-CM | POA: Diagnosis not present

## 2021-05-12 DIAGNOSIS — Z7689 Persons encountering health services in other specified circumstances: Secondary | ICD-10-CM | POA: Diagnosis not present

## 2021-05-18 DIAGNOSIS — Z7689 Persons encountering health services in other specified circumstances: Secondary | ICD-10-CM | POA: Diagnosis not present

## 2021-05-18 DIAGNOSIS — Z7151 Drug abuse counseling and surveillance of drug abuser: Secondary | ICD-10-CM | POA: Diagnosis not present

## 2021-05-19 DIAGNOSIS — Z7689 Persons encountering health services in other specified circumstances: Secondary | ICD-10-CM | POA: Diagnosis not present

## 2021-05-25 DIAGNOSIS — Z3042 Encounter for surveillance of injectable contraceptive: Secondary | ICD-10-CM | POA: Diagnosis not present

## 2021-05-25 DIAGNOSIS — Z3202 Encounter for pregnancy test, result negative: Secondary | ICD-10-CM | POA: Diagnosis not present

## 2021-05-25 DIAGNOSIS — Z7689 Persons encountering health services in other specified circumstances: Secondary | ICD-10-CM | POA: Diagnosis not present

## 2021-05-25 DIAGNOSIS — Z7151 Drug abuse counseling and surveillance of drug abuser: Secondary | ICD-10-CM | POA: Diagnosis not present

## 2021-05-26 DIAGNOSIS — Z7689 Persons encountering health services in other specified circumstances: Secondary | ICD-10-CM | POA: Diagnosis not present

## 2021-05-26 DIAGNOSIS — Z7151 Drug abuse counseling and surveillance of drug abuser: Secondary | ICD-10-CM | POA: Diagnosis not present

## 2021-06-01 DIAGNOSIS — Z7689 Persons encountering health services in other specified circumstances: Secondary | ICD-10-CM | POA: Diagnosis not present

## 2021-06-01 DIAGNOSIS — Z7151 Drug abuse counseling and surveillance of drug abuser: Secondary | ICD-10-CM | POA: Diagnosis not present

## 2021-06-02 DIAGNOSIS — Z7689 Persons encountering health services in other specified circumstances: Secondary | ICD-10-CM | POA: Diagnosis not present

## 2021-06-02 DIAGNOSIS — Z7151 Drug abuse counseling and surveillance of drug abuser: Secondary | ICD-10-CM | POA: Diagnosis not present

## 2021-06-03 DIAGNOSIS — Z419 Encounter for procedure for purposes other than remedying health state, unspecified: Secondary | ICD-10-CM | POA: Diagnosis not present

## 2021-06-07 DIAGNOSIS — Z7151 Drug abuse counseling and surveillance of drug abuser: Secondary | ICD-10-CM | POA: Diagnosis not present

## 2021-06-07 DIAGNOSIS — Z7689 Persons encountering health services in other specified circumstances: Secondary | ICD-10-CM | POA: Diagnosis not present

## 2021-06-08 DIAGNOSIS — Z7151 Drug abuse counseling and surveillance of drug abuser: Secondary | ICD-10-CM | POA: Diagnosis not present

## 2021-06-08 DIAGNOSIS — Z7689 Persons encountering health services in other specified circumstances: Secondary | ICD-10-CM | POA: Diagnosis not present

## 2021-06-09 DIAGNOSIS — Z03818 Encounter for observation for suspected exposure to other biological agents ruled out: Secondary | ICD-10-CM | POA: Diagnosis not present

## 2021-06-09 DIAGNOSIS — Z7689 Persons encountering health services in other specified circumstances: Secondary | ICD-10-CM | POA: Diagnosis not present

## 2021-06-15 DIAGNOSIS — Z7151 Drug abuse counseling and surveillance of drug abuser: Secondary | ICD-10-CM | POA: Diagnosis not present

## 2021-06-15 DIAGNOSIS — Z7689 Persons encountering health services in other specified circumstances: Secondary | ICD-10-CM | POA: Diagnosis not present

## 2021-06-16 DIAGNOSIS — Z7151 Drug abuse counseling and surveillance of drug abuser: Secondary | ICD-10-CM | POA: Diagnosis not present

## 2021-06-16 DIAGNOSIS — Z7689 Persons encountering health services in other specified circumstances: Secondary | ICD-10-CM | POA: Diagnosis not present

## 2021-06-22 DIAGNOSIS — Z7151 Drug abuse counseling and surveillance of drug abuser: Secondary | ICD-10-CM | POA: Diagnosis not present

## 2021-06-22 DIAGNOSIS — Z7689 Persons encountering health services in other specified circumstances: Secondary | ICD-10-CM | POA: Diagnosis not present

## 2021-06-23 DIAGNOSIS — Z7689 Persons encountering health services in other specified circumstances: Secondary | ICD-10-CM | POA: Diagnosis not present

## 2021-06-23 DIAGNOSIS — Z7151 Drug abuse counseling and surveillance of drug abuser: Secondary | ICD-10-CM | POA: Diagnosis not present

## 2021-06-27 DIAGNOSIS — Z0000 Encounter for general adult medical examination without abnormal findings: Secondary | ICD-10-CM | POA: Diagnosis not present

## 2021-06-27 DIAGNOSIS — Z6836 Body mass index (BMI) 36.0-36.9, adult: Secondary | ICD-10-CM | POA: Diagnosis not present

## 2021-06-27 DIAGNOSIS — Z1151 Encounter for screening for human papillomavirus (HPV): Secondary | ICD-10-CM | POA: Diagnosis not present

## 2021-06-29 DIAGNOSIS — Z7689 Persons encountering health services in other specified circumstances: Secondary | ICD-10-CM | POA: Diagnosis not present

## 2021-06-29 DIAGNOSIS — Z7151 Drug abuse counseling and surveillance of drug abuser: Secondary | ICD-10-CM | POA: Diagnosis not present

## 2021-06-30 DIAGNOSIS — Z7689 Persons encountering health services in other specified circumstances: Secondary | ICD-10-CM | POA: Diagnosis not present

## 2021-07-04 DIAGNOSIS — Z419 Encounter for procedure for purposes other than remedying health state, unspecified: Secondary | ICD-10-CM | POA: Diagnosis not present

## 2021-07-06 DIAGNOSIS — Z7151 Drug abuse counseling and surveillance of drug abuser: Secondary | ICD-10-CM | POA: Diagnosis not present

## 2021-07-06 DIAGNOSIS — Z7689 Persons encountering health services in other specified circumstances: Secondary | ICD-10-CM | POA: Diagnosis not present

## 2021-07-07 DIAGNOSIS — Z03818 Encounter for observation for suspected exposure to other biological agents ruled out: Secondary | ICD-10-CM | POA: Diagnosis not present

## 2021-07-07 DIAGNOSIS — Z7689 Persons encountering health services in other specified circumstances: Secondary | ICD-10-CM | POA: Diagnosis not present

## 2021-07-13 DIAGNOSIS — Z7689 Persons encountering health services in other specified circumstances: Secondary | ICD-10-CM | POA: Diagnosis not present

## 2021-07-13 DIAGNOSIS — Z7151 Drug abuse counseling and surveillance of drug abuser: Secondary | ICD-10-CM | POA: Diagnosis not present

## 2021-07-14 DIAGNOSIS — Z7689 Persons encountering health services in other specified circumstances: Secondary | ICD-10-CM | POA: Diagnosis not present

## 2021-07-20 DIAGNOSIS — Z7689 Persons encountering health services in other specified circumstances: Secondary | ICD-10-CM | POA: Diagnosis not present

## 2021-07-20 DIAGNOSIS — Z7151 Drug abuse counseling and surveillance of drug abuser: Secondary | ICD-10-CM | POA: Diagnosis not present

## 2021-07-21 DIAGNOSIS — Z7689 Persons encountering health services in other specified circumstances: Secondary | ICD-10-CM | POA: Diagnosis not present

## 2021-07-21 DIAGNOSIS — Z7151 Drug abuse counseling and surveillance of drug abuser: Secondary | ICD-10-CM | POA: Diagnosis not present

## 2021-07-27 DIAGNOSIS — Z7151 Drug abuse counseling and surveillance of drug abuser: Secondary | ICD-10-CM | POA: Diagnosis not present

## 2021-07-27 DIAGNOSIS — Z7689 Persons encountering health services in other specified circumstances: Secondary | ICD-10-CM | POA: Diagnosis not present

## 2021-07-28 DIAGNOSIS — Z7689 Persons encountering health services in other specified circumstances: Secondary | ICD-10-CM | POA: Diagnosis not present

## 2021-07-28 DIAGNOSIS — Z7151 Drug abuse counseling and surveillance of drug abuser: Secondary | ICD-10-CM | POA: Diagnosis not present

## 2021-08-01 DIAGNOSIS — Z7689 Persons encountering health services in other specified circumstances: Secondary | ICD-10-CM | POA: Diagnosis not present

## 2021-08-03 DIAGNOSIS — Z7151 Drug abuse counseling and surveillance of drug abuser: Secondary | ICD-10-CM | POA: Diagnosis not present

## 2021-08-03 DIAGNOSIS — Z7689 Persons encountering health services in other specified circumstances: Secondary | ICD-10-CM | POA: Diagnosis not present

## 2021-08-04 DIAGNOSIS — Z419 Encounter for procedure for purposes other than remedying health state, unspecified: Secondary | ICD-10-CM | POA: Diagnosis not present

## 2021-08-04 DIAGNOSIS — Z03818 Encounter for observation for suspected exposure to other biological agents ruled out: Secondary | ICD-10-CM | POA: Diagnosis not present

## 2021-08-04 DIAGNOSIS — Z7689 Persons encountering health services in other specified circumstances: Secondary | ICD-10-CM | POA: Diagnosis not present

## 2021-08-10 DIAGNOSIS — Z7151 Drug abuse counseling and surveillance of drug abuser: Secondary | ICD-10-CM | POA: Diagnosis not present

## 2021-08-10 DIAGNOSIS — Z7689 Persons encountering health services in other specified circumstances: Secondary | ICD-10-CM | POA: Diagnosis not present

## 2021-08-11 DIAGNOSIS — Z7689 Persons encountering health services in other specified circumstances: Secondary | ICD-10-CM | POA: Diagnosis not present

## 2021-08-11 DIAGNOSIS — Z7151 Drug abuse counseling and surveillance of drug abuser: Secondary | ICD-10-CM | POA: Diagnosis not present

## 2021-08-12 DIAGNOSIS — Z3042 Encounter for surveillance of injectable contraceptive: Secondary | ICD-10-CM | POA: Diagnosis not present

## 2021-08-17 DIAGNOSIS — Z7151 Drug abuse counseling and surveillance of drug abuser: Secondary | ICD-10-CM | POA: Diagnosis not present

## 2021-08-18 DIAGNOSIS — Z7689 Persons encountering health services in other specified circumstances: Secondary | ICD-10-CM | POA: Diagnosis not present

## 2021-08-22 DIAGNOSIS — Z7689 Persons encountering health services in other specified circumstances: Secondary | ICD-10-CM | POA: Diagnosis not present

## 2021-08-24 DIAGNOSIS — Z7689 Persons encountering health services in other specified circumstances: Secondary | ICD-10-CM | POA: Diagnosis not present

## 2021-08-24 DIAGNOSIS — Z7151 Drug abuse counseling and surveillance of drug abuser: Secondary | ICD-10-CM | POA: Diagnosis not present

## 2021-08-25 DIAGNOSIS — Z7689 Persons encountering health services in other specified circumstances: Secondary | ICD-10-CM | POA: Diagnosis not present

## 2021-08-29 DIAGNOSIS — Z7151 Drug abuse counseling and surveillance of drug abuser: Secondary | ICD-10-CM | POA: Diagnosis not present

## 2021-08-29 DIAGNOSIS — Z7689 Persons encountering health services in other specified circumstances: Secondary | ICD-10-CM | POA: Diagnosis not present

## 2021-08-30 DIAGNOSIS — Z7689 Persons encountering health services in other specified circumstances: Secondary | ICD-10-CM | POA: Diagnosis not present

## 2021-08-31 DIAGNOSIS — Z7151 Drug abuse counseling and surveillance of drug abuser: Secondary | ICD-10-CM | POA: Diagnosis not present

## 2021-08-31 DIAGNOSIS — Z7689 Persons encountering health services in other specified circumstances: Secondary | ICD-10-CM | POA: Diagnosis not present

## 2021-09-01 DIAGNOSIS — Z7689 Persons encountering health services in other specified circumstances: Secondary | ICD-10-CM | POA: Diagnosis not present

## 2021-09-01 DIAGNOSIS — Z03818 Encounter for observation for suspected exposure to other biological agents ruled out: Secondary | ICD-10-CM | POA: Diagnosis not present

## 2021-09-03 DIAGNOSIS — Z419 Encounter for procedure for purposes other than remedying health state, unspecified: Secondary | ICD-10-CM | POA: Diagnosis not present

## 2021-09-05 DIAGNOSIS — Z7151 Drug abuse counseling and surveillance of drug abuser: Secondary | ICD-10-CM | POA: Diagnosis not present

## 2021-09-05 DIAGNOSIS — Z7689 Persons encountering health services in other specified circumstances: Secondary | ICD-10-CM | POA: Diagnosis not present

## 2021-09-06 ENCOUNTER — Other Ambulatory Visit: Payer: Self-pay

## 2021-09-06 ENCOUNTER — Encounter: Payer: Self-pay | Admitting: Nurse Practitioner

## 2021-09-06 ENCOUNTER — Ambulatory Visit: Payer: Medicaid Other | Admitting: Nurse Practitioner

## 2021-09-06 VITALS — BP 121/80 | HR 89 | Ht 63.0 in | Wt 199.0 lb

## 2021-09-06 DIAGNOSIS — H9201 Otalgia, right ear: Secondary | ICD-10-CM | POA: Insufficient documentation

## 2021-09-06 MED ORDER — CETIRIZINE HCL 10 MG PO TABS: 10.0000 mg | ORAL_TABLET | Freq: Every day | ORAL | 1 refills | Status: DC

## 2021-09-06 MED ORDER — PREDNISONE 10 MG (21) PO TBPK: ORAL_TABLET | ORAL | 0 refills | Status: DC

## 2021-09-06 NOTE — Progress Notes (Signed)
New Patient Note  RE: Rebekah [name] MRN: [id number] DOB: 1986-07-09 Date of Office Visit: 09/06/2021  Chief Complaint: Ear Pain (Right. Feels a bump in bowl.)  History of Present Illness: Otalgia  The current episode started 1 to 4 weeks ago. The problem occurs constantly. The problem has been unchanged. There has been no fever. The pain is mild. Pertinent negatives include no coughing, ear discharge, headaches or sore throat. Treatments tried: OTC simmers ear medication.    Assessment and Plan: Rebekah [name] is a 34 y.o. female with: Right ear pain Unresolved ear pain and fullness. Patient recently completed antibiotic (amoxicillin) for dental work,  Educated patient to quit smoking, Zyrtec 10 mg daily, prednisone taper, tylenol or ibuprofen for pain. Follow up with unresolved symptoms. RX sent to pharmacy, printed handout given  OI  Return if symptoms worsen or fail to improve.   Diagnostics:   Past Medical History: Patient Active Problem List   Diagnosis Date Noted  . Right ear pain 09/06/2021  . Hyperlipidemia   . Positive urine drug screen 05/02/2018  . Opioid dependence on agonist therapy (Rebekah [name]) 01/22/2018  . 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 Rash and nonspecific skin eruption 02/18/2016   Past Medical History:  Diagnosis Date  . Hyperlipidemia    Past Surgical History: History reviewed. No pertinent surgical history. Medication List:  Current Outpatient Medications  Medication Sig Dispense Refill  . acetaminophen (TYLENOL) 500 MG tablet Take 500 mg by mouth every 6 (six) hours as needed for mild pain or moderate pain.    . Biotin 10 MG TABS Take 1 tablet by mouth daily.    . buprenorphine (SUBUTEX) 8 MG SUBL SL tablet Place 8 mg under the tongue daily.    . cetirizine (ZYRTEC ALLERGY) 10 MG tablet Take 1 tablet (10 mg total) by mouth daily. 30 tablet 1  . gabapentin (NEURONTIN) 300 MG capsule Take 600-1,200 mg by mouth  See admin instructions. Take 600mg  by mouth 4 times daily and take 1200mg  at bedtime    . medroxyPROGESTERone (DEPO-PROVERA) 150 MG/ML injection Inject 150 mg into the muscle every 3 (three) months.   4  . methocarbamol (ROBAXIN) 500 MG tablet Take 500 mg by mouth 4 (four) times daily.    . predniSONE (STERAPRED UNI-PAK 21 TAB) 10 MG (21) TBPK tablet 6 tablet day 1, 5 tablet day 2, 4 tablet day 3, 3 tablet day 4, 2 tablet day 5, 1 tablet day 6 1 each 0   No current facility-administered medications for this visit.   Allergies: No Known Allergies Social History: Social History   Socioeconomic History  . Marital status: Legally Separated    Spouse name: Not on file  . Number of children: Not on file  . Years of education: Not on file  . Highest education level: Not on file  Occupational History  . Not on file  Tobacco Use  . Smoking status: Every Day    Packs/day: 0.25    Years: 10.00    Pack years: 2.50    Types: Cigarettes  . Smokeless tobacco: Never  Substance and Sexual Activity  . Alcohol use: Yes  . Drug use: No  . Sexual activity: Yes    Birth control/protection: Pill  Other Topics Concern  . Not on file  Social History Narrative  . Not on file   Social Determinants of Health   Financial Resource Strain: Not on file  Food Insecurity: Not on file  Transportation  Needs: Not on file  Physical Activity: Not on file  Stress: Not on file  Social Connections: Not on file       Family History: Family History  Problem Relation Age of Onset  . Diabetes Father          Review of Systems  Constitutional:  Negative for chills and fever.  HENT:  Positive for ear pain. Negative for ear discharge and sore throat.   Eyes: Negative.   Respiratory: Negative.  Negative for cough.   Neurological:  Negative for headaches.  All other systems reviewed and are negative. Objective: BP 121/80   Pulse 89   Ht 5\' 3"  (1.6 m)   Wt 199 lb (90.3 kg)   SpO2 99%   BMI 35.25  kg/m  Body mass index is 35.25 kg/m. Physical Exam Vitals and nursing note reviewed.  Constitutional:      Appearance: Normal appearance.  HENT:     Head: Normocephalic.     Right Ear: Decreased hearing noted. Tenderness present. No laceration or drainage. No foreign body.     Ears:     Comments: Ear pain with Muffled sound sensation    Nose: Nose normal.     Mouth/Throat:     Mouth: Mucous membranes are moist.     Pharynx: Oropharynx is clear.  Eyes:     Conjunctiva/sclera: Conjunctivae normal.  Cardiovascular:     Rate and Rhythm: Normal rate and regular rhythm.     Pulses: Normal pulses.     Heart sounds: Normal heart sounds.  Pulmonary:     Effort: Pulmonary effort is normal.     Breath sounds: Normal breath sounds.  Abdominal:     General: Bowel sounds are normal.  Skin:    Findings: No rash.  Neurological:     Mental Status: She is alert and oriented to person, place, and time.   The plan was reviewed with the patient/family, and all questions/concerned were addressed.  It was my pleasure to see Rebekah [name] today and participate in her care. Please feel free to contact me with any questions or concerns.  Sincerely,  Jac Canavan NP [place]

## 2021-09-06 NOTE — Patient Instructions (Signed)
Earache, Adult An earache, or ear pain, can be caused by many things, including: An infection. Ear wax buildup. Ear pressure. Something in the ear that should not be there (foreign body). A sore throat. Tooth problems. Jaw problems. Treatment of the earache will depend on the cause. If the cause is not clear or cannot be determined, you may need to watch your symptoms until your earache goes away or until a cause is found. Follow these instructions at home: Medicines Take or apply over-the-counter and prescription medicines only as told by your health care provider. If you were prescribed an antibiotic medicine, use it as told by your health care provider. Do not stop using the antibiotic even if you start to feel better. Do not put anything in your ear other than medicine that is prescribed by your health care provider. Managing pain If directed, apply heat to the affected area as often as told by your health care provider. Use the heat source that your health care provider recommends, such as a moist heat pack or a heating pad. Place a towel between your skin and the heat source. Leave the heat on for 20-30 minutes. Remove the heat if your skin turns bright red. This is especially important if you are unable to feel pain, heat, or cold. You may have a greater risk of getting burned. If directed, put ice on the affected area as often as told by your health care provider. To do this:   Put ice in a plastic bag. Place a towel between your skin and the bag. Leave the ice on for 20 minutes, 2-3 times a day. General instructions Pay attention to any changes in your symptoms. Try resting in an upright position instead of lying down. This may help to reduce pressure in your ear and relieve pain. Chew gum if it helps to relieve your ear pain. Treat any allergies as told by your health care provider. Drink enough fluid to keep your urine pale yellow. It is up to you to get the results of any  tests that were done. Ask your health care provider, or the department that is doing the tests, when your results will be ready. Keep all follow-up visits as told by your health care provider. This is important. Contact a health care provider if: Your pain does not improve within 2 days. Your earache gets worse. You have new symptoms. You have a fever. Get help right away if you: Have a severe headache. Have a stiff neck. Have trouble swallowing. Have redness or swelling behind your ear. Have fluid or blood coming from your ear. Have hearing loss. Feel dizzy. Summary An earache, or ear pain, can be caused by many things. Treatment of the earache will depend on the cause. Follow recommendations from your health care provider to treat your ear pain. If the cause is not clear or cannot be determined, you may need to watch your symptoms until your earache goes away or until a cause is found. Keep all follow-up visits as told by your health care provider. This is important. This information is not intended to replace advice given to you by your health care provider. Make sure you discuss any questions you have with your health care provider. Document Revised: 06/28/2019 Document Reviewed: 06/28/2019 Elsevier Patient Education  2022 Elsevier Inc.  

## 2021-09-06 NOTE — Assessment & Plan Note (Signed)
Unresolved ear pain and fullness. Patient recently completed antibiotic (amoxicillin) for dental work,  Educated patient to quit smoking, Zyrtec 10 mg daily, prednisone taper, tylenol or ibuprofen for pain. Follow up with unresolved symptoms. RX sent to pharmacy, printed handout given  OI

## 2021-09-07 DIAGNOSIS — Z7151 Drug abuse counseling and surveillance of drug abuser: Secondary | ICD-10-CM | POA: Diagnosis not present

## 2021-09-07 DIAGNOSIS — Z7689 Persons encountering health services in other specified circumstances: Secondary | ICD-10-CM | POA: Diagnosis not present

## 2021-09-08 DIAGNOSIS — Z7689 Persons encountering health services in other specified circumstances: Secondary | ICD-10-CM | POA: Diagnosis not present

## 2021-09-08 DIAGNOSIS — Z7151 Drug abuse counseling and surveillance of drug abuser: Secondary | ICD-10-CM | POA: Diagnosis not present

## 2021-09-14 DIAGNOSIS — Z7151 Drug abuse counseling and surveillance of drug abuser: Secondary | ICD-10-CM | POA: Diagnosis not present

## 2021-09-14 DIAGNOSIS — Z7689 Persons encountering health services in other specified circumstances: Secondary | ICD-10-CM | POA: Diagnosis not present

## 2021-09-15 DIAGNOSIS — Z7689 Persons encountering health services in other specified circumstances: Secondary | ICD-10-CM | POA: Diagnosis not present

## 2021-09-15 DIAGNOSIS — Z7151 Drug abuse counseling and surveillance of drug abuser: Secondary | ICD-10-CM | POA: Diagnosis not present

## 2021-09-21 DIAGNOSIS — Z7689 Persons encountering health services in other specified circumstances: Secondary | ICD-10-CM | POA: Diagnosis not present

## 2021-09-21 DIAGNOSIS — Z7151 Drug abuse counseling and surveillance of drug abuser: Secondary | ICD-10-CM | POA: Diagnosis not present

## 2021-09-22 DIAGNOSIS — Z7689 Persons encountering health services in other specified circumstances: Secondary | ICD-10-CM | POA: Diagnosis not present

## 2021-09-28 DIAGNOSIS — Z7689 Persons encountering health services in other specified circumstances: Secondary | ICD-10-CM | POA: Diagnosis not present

## 2021-09-28 DIAGNOSIS — Z7151 Drug abuse counseling and surveillance of drug abuser: Secondary | ICD-10-CM | POA: Diagnosis not present

## 2021-09-29 DIAGNOSIS — Z7689 Persons encountering health services in other specified circumstances: Secondary | ICD-10-CM | POA: Diagnosis not present

## 2021-09-29 DIAGNOSIS — Z03818 Encounter for observation for suspected exposure to other biological agents ruled out: Secondary | ICD-10-CM | POA: Diagnosis not present

## 2021-10-04 DIAGNOSIS — Z419 Encounter for procedure for purposes other than remedying health state, unspecified: Secondary | ICD-10-CM | POA: Diagnosis not present

## 2021-10-05 DIAGNOSIS — Z7689 Persons encountering health services in other specified circumstances: Secondary | ICD-10-CM | POA: Diagnosis not present

## 2021-10-05 DIAGNOSIS — Z7151 Drug abuse counseling and surveillance of drug abuser: Secondary | ICD-10-CM | POA: Diagnosis not present

## 2021-10-06 DIAGNOSIS — Z7151 Drug abuse counseling and surveillance of drug abuser: Secondary | ICD-10-CM | POA: Diagnosis not present

## 2021-10-06 DIAGNOSIS — Z7689 Persons encountering health services in other specified circumstances: Secondary | ICD-10-CM | POA: Diagnosis not present

## 2021-10-12 DIAGNOSIS — Z7689 Persons encountering health services in other specified circumstances: Secondary | ICD-10-CM | POA: Diagnosis not present

## 2021-10-12 DIAGNOSIS — Z7151 Drug abuse counseling and surveillance of drug abuser: Secondary | ICD-10-CM | POA: Diagnosis not present

## 2021-10-13 ENCOUNTER — Encounter: Payer: Self-pay | Admitting: Family

## 2021-10-13 ENCOUNTER — Ambulatory Visit: Payer: Medicaid Other | Admitting: Family

## 2021-10-13 ENCOUNTER — Other Ambulatory Visit: Payer: Self-pay

## 2021-10-13 VITALS — BP 129/90 | HR 86 | Temp 98.2°F | Ht <= 58 in | Wt 205.6 lb

## 2021-10-13 DIAGNOSIS — H6981 Other specified disorders of Eustachian tube, right ear: Secondary | ICD-10-CM

## 2021-10-13 DIAGNOSIS — H938X1 Other specified disorders of right ear: Secondary | ICD-10-CM | POA: Diagnosis not present

## 2021-10-13 DIAGNOSIS — Z7689 Persons encountering health services in other specified circumstances: Secondary | ICD-10-CM | POA: Diagnosis not present

## 2021-10-13 DIAGNOSIS — R079 Chest pain, unspecified: Secondary | ICD-10-CM | POA: Diagnosis not present

## 2021-10-13 DIAGNOSIS — Z7151 Drug abuse counseling and surveillance of drug abuser: Secondary | ICD-10-CM | POA: Diagnosis not present

## 2021-10-13 DIAGNOSIS — H9201 Otalgia, right ear: Secondary | ICD-10-CM | POA: Diagnosis not present

## 2021-10-13 MED ORDER — CETIRIZINE HCL 10 MG PO TABS: 10.0000 mg | ORAL_TABLET | Freq: Every day | ORAL | 1 refills | Status: DC

## 2021-10-13 MED ORDER — FLUTICASONE PROPIONATE 50 MCG/ACT NA SUSP: 2.0000 | Freq: Every day | NASAL | 6 refills | Status: DC

## 2021-10-13 MED ORDER — GUAIFENESIN ER 600 MG PO TB12: 600.0000 mg | ORAL_TABLET | Freq: Two times a day (BID) | ORAL | 2 refills | Status: DC | PRN

## 2021-10-13 NOTE — Progress Notes (Signed)
Subjective:    Patient ID: Rebekah [name], female    DOB: 05-Nov-1986, 35 y.o.   MRN: [id number]  Chief Complaint  Patient presents with   Ear Pain    Right ear times 2 mths    Headache   Chest Pain   Pt presents to the office today with right ear fullness. She was seen in the office on 09/06/21 and started on Zyrtec and prednisone. She reports these helped slightly.   Headache  Pertinent negatives include no coughing.  Chest Pain  This is a new problem. The current episode started in the past 7 days. The onset quality is gradual. The problem occurs intermittently. The pain is present in the lateral region. Associated symptoms include headaches. Pertinent negatives include no cough, irregular heartbeat, lower extremity edema, malaise/fatigue, shortness of breath or syncope.  Ear Fullness  There is pain in the right ear. The current episode started more than 1 month ago. The problem occurs every few minutes. There has been no fever. Associated symptoms include headaches. Pertinent negatives include no coughing or ear discharge. She has tried ear drops for the symptoms. The treatment provided no relief.  Nicotine Dependence Presents for follow-up visit. Her urge triggers include company of smokers. The symptoms have been stable. She smokes 1 pack of cigarettes per day. Compliance with prior treatments has been good.     Review of Systems  Constitutional:  Negative for malaise/fatigue.  HENT:  Negative for ear discharge.   Respiratory:  Negative for cough and shortness of breath.   Cardiovascular:  Positive for chest pain. Negative for syncope.  Neurological:  Positive for headaches.  All other systems reviewed and are negative.     Objective:   Physical Exam Vitals reviewed.  Constitutional:      General: She is not in acute distress.    Appearance: She is well-developed.  HENT:     Head: Normocephalic and atraumatic.     Right Ear: External ear normal.  Eyes:     Pupils:  Pupils are equal, round, and reactive to light.  Neck:     Thyroid: No thyromegaly.  Cardiovascular:     Rate and Rhythm: Normal rate and regular rhythm.     Heart sounds: Normal heart sounds. No murmur heard. Pulmonary:     Effort: Pulmonary effort is normal. No respiratory distress.     Breath sounds: Normal breath sounds. No wheezing.  Abdominal:     General: Bowel sounds are normal. There is no distension.     Palpations: Abdomen is soft.     Tenderness: There is no abdominal tenderness.  Musculoskeletal:        General: No tenderness. Normal range of motion.     Cervical back: Normal range of motion and neck supple.  Skin:    General: Skin is warm and dry.  Neurological:     Mental Status: She is alert and oriented to person, place, and time.     Cranial Nerves: No cranial nerve deficit.     Deep Tendon Reflexes: Reflexes are normal and symmetric.  Psychiatric:        Behavior: Behavior normal.        Thought Content: Thought content normal.        Judgment: Judgment normal.     BP 129/90   Pulse 86   Temp 98.2 F (36.8 C) (Temporal)   Ht 2' (0.61 m)   Wt 205 lb 9.6 oz (93.3 kg)   BMI 250.96  kg/m       Assessment & Plan:  Rebekah [name] comes in today with chief complaint of Ear Pain (Right ear times 2 mths ), Headache, and Chest Pain   Diagnosis and orders addressed:  1. Chest pain, unspecified type Negative - EKG 12-Lead  2. Sensation of fullness in right ear   3. Dysfunction of right eustachian tube Start Mucinex, zyrtec, flonase If problems continue will need referral to ENT - guaiFENesin (MUCINEX) 600 MG 12 hr tablet; Take 1 tablet (600 mg total) by mouth 2 (two) times daily as needed.  Dispense: 60 tablet; Refill: 2 - cetirizine (ZYRTEC ALLERGY) 10 MG tablet; Take 1 tablet (10 mg total) by mouth daily.  Dispense: 90 tablet; Refill: 1 - fluticasone (FLONASE) 50 MCG/ACT nasal spray; Place 2 sprays into both nostrils daily.  Dispense: 16 g; Refill:  6  4. Right ear pain    Follow up plan: As needed   Evelina Dun, FNP

## 2021-10-13 NOTE — Patient Instructions (Signed)
Eustachian Tube Dysfunction °Eustachian tube dysfunction refers to a condition in which a blockage develops in the narrow passage that connects the middle ear to the back of the nose (eustachian tube). The eustachian tube regulates air pressure in the middle ear by letting air move between the ear and nose. It also helps to drain fluid from the middle ear space. °Eustachian tube dysfunction can affect one or both ears. When the eustachian tube does not function properly, air pressure, fluid, or both can build up in the middle ear. °What are the causes? °This condition occurs when the eustachian tube becomes blocked or cannot open normally. Common causes of this condition include: °Ear infections. °Colds and other infections that affect the nose, mouth, and throat (upper respiratory tract). °Allergies. °Irritation from cigarette smoke. °Irritation from stomach acid coming up into the esophagus (gastroesophageal reflux). The esophagus is the part of the body that moves food from the mouth to the stomach. °Sudden changes in air pressure, such as from descending in an airplane or scuba diving. °Abnormal growths in the nose or throat, such as: °Growths that line the nose (nasal polyps). °Abnormal growth of cells (tumors). °Enlarged tissue at the back of the throat (adenoids). °What increases the risk? °You are more likely to develop this condition if: °You smoke. °You are overweight. °You are a child who has: °Certain birth defects of the mouth, such as cleft palate. °Large tonsils or adenoids. °What are the signs or symptoms? °Common symptoms of this condition include: °A feeling of fullness in the ear. °Ear pain. °Clicking or popping noises in the ear. °Ringing in the ear (tinnitus). °Hearing loss. °Loss of balance. °Dizziness. °Symptoms may get worse when the air pressure around you changes, such as when you travel to an area of high elevation, fly on an airplane, or go scuba diving. °How is this diagnosed? °This  condition may be diagnosed based on: °Your symptoms. °A physical exam of your ears, nose, and throat. °Tests, such as those that measure: °The movement of your eardrum. °Your hearing (audiometry). °How is this treated? °Treatment depends on the cause and severity of your condition. °In mild cases, you may relieve your symptoms by moving air into your ears. This is called "popping the ears." °In more severe cases, or if you have symptoms of fluid in your ears, treatment may include: °Medicines to relieve congestion (decongestants). °Medicines that treat allergies (antihistamines). °Nasal sprays or ear drops that contain medicines that reduce swelling (steroids). °A procedure to drain the fluid in your eardrum. In this procedure, a small tube may be placed in the eardrum to: °Drain the fluid. °Restore the air in the middle ear space. °A procedure to insert a balloon device through the nose to inflate the opening of the eustachian tube (balloon dilation). °Follow these instructions at home: °Lifestyle °Do not do any of the following until your health care provider approves: °Travel to high altitudes. °Fly in airplanes. °Work in a pressurized cabin or room. °Scuba dive. °Do not use any products that contain nicotine or tobacco. These products include cigarettes, chewing tobacco, and vaping devices, such as e-cigarettes. If you need help quitting, ask your health care provider. °Keep your ears dry. Wear fitted earplugs during showering and bathing. Dry your ears completely after. °General instructions °Take over-the-counter and prescription medicines only as told by your health care provider. °Use techniques to help pop your ears as recommended by your health care provider. These may include: °Chewing gum. °Yawning. °Frequent, forceful swallowing. °Closing   your mouth, holding your nose closed, and gently blowing as if you are trying to blow air out of your nose. °Keep all follow-up visits. This is important. °Contact a  health care provider if: °Your symptoms do not go away after treatment. °Your symptoms come back after treatment. °You are unable to pop your ears. °You have: °A fever. °Pain in your ear. °Pain in your head or neck. °Fluid draining from your ear. °Your hearing suddenly changes. °You become very dizzy. °You lose your balance. °Get help right away if: °You have a sudden, severe increase in any of your symptoms. °Summary °Eustachian tube dysfunction refers to a condition in which a blockage develops in the eustachian tube. °It can be caused by ear infections, allergies, inhaled irritants, or abnormal growths in the nose or throat. °Symptoms may include ear pain or fullness, hearing loss, or ringing in the ears. °Mild cases are treated with techniques to unblock the ears, such as yawning or chewing gum. °More severe cases are treated with medicines or procedures. °This information is not intended to replace advice given to you by your health care provider. Make sure you discuss any questions you have with your health care provider. °Document Revised: 01/31/2021 Document Reviewed: 01/31/2021 °Elsevier Patient Education © 2022 Elsevier Inc. ° °

## 2021-10-19 DIAGNOSIS — Z7689 Persons encountering health services in other specified circumstances: Secondary | ICD-10-CM | POA: Diagnosis not present

## 2021-10-19 DIAGNOSIS — Z7151 Drug abuse counseling and surveillance of drug abuser: Secondary | ICD-10-CM | POA: Diagnosis not present

## 2021-10-20 DIAGNOSIS — Z7689 Persons encountering health services in other specified circumstances: Secondary | ICD-10-CM | POA: Diagnosis not present

## 2021-10-24 DIAGNOSIS — Z7151 Drug abuse counseling and surveillance of drug abuser: Secondary | ICD-10-CM | POA: Diagnosis not present

## 2021-10-24 DIAGNOSIS — Z7689 Persons encountering health services in other specified circumstances: Secondary | ICD-10-CM | POA: Diagnosis not present

## 2021-10-25 DIAGNOSIS — Z7689 Persons encountering health services in other specified circumstances: Secondary | ICD-10-CM | POA: Diagnosis not present

## 2021-10-25 DIAGNOSIS — Z03818 Encounter for observation for suspected exposure to other biological agents ruled out: Secondary | ICD-10-CM | POA: Diagnosis not present

## 2021-10-26 DIAGNOSIS — Z7689 Persons encountering health services in other specified circumstances: Secondary | ICD-10-CM | POA: Diagnosis not present

## 2021-10-26 DIAGNOSIS — R87612 Low grade squamous intraepithelial lesion on cytologic smear of cervix (LGSIL): Secondary | ICD-10-CM | POA: Diagnosis not present

## 2021-10-26 DIAGNOSIS — Z7151 Drug abuse counseling and surveillance of drug abuser: Secondary | ICD-10-CM | POA: Diagnosis not present

## 2021-10-31 DIAGNOSIS — Z3042 Encounter for surveillance of injectable contraceptive: Secondary | ICD-10-CM | POA: Diagnosis not present

## 2021-11-02 DIAGNOSIS — Z7689 Persons encountering health services in other specified circumstances: Secondary | ICD-10-CM | POA: Diagnosis not present

## 2021-11-02 DIAGNOSIS — Z7151 Drug abuse counseling and surveillance of drug abuser: Secondary | ICD-10-CM | POA: Diagnosis not present

## 2021-11-03 DIAGNOSIS — Z419 Encounter for procedure for purposes other than remedying health state, unspecified: Secondary | ICD-10-CM | POA: Diagnosis not present

## 2021-11-03 DIAGNOSIS — Z7689 Persons encountering health services in other specified circumstances: Secondary | ICD-10-CM | POA: Diagnosis not present

## 2021-11-09 DIAGNOSIS — Z7689 Persons encountering health services in other specified circumstances: Secondary | ICD-10-CM | POA: Diagnosis not present

## 2021-11-10 DIAGNOSIS — Z7151 Drug abuse counseling and surveillance of drug abuser: Secondary | ICD-10-CM | POA: Diagnosis not present

## 2021-11-10 DIAGNOSIS — Z7689 Persons encountering health services in other specified circumstances: Secondary | ICD-10-CM | POA: Diagnosis not present

## 2021-11-14 DIAGNOSIS — R87612 Low grade squamous intraepithelial lesion on cytologic smear of cervix (LGSIL): Secondary | ICD-10-CM | POA: Diagnosis not present

## 2021-11-16 DIAGNOSIS — Z7151 Drug abuse counseling and surveillance of drug abuser: Secondary | ICD-10-CM | POA: Diagnosis not present

## 2021-11-16 DIAGNOSIS — Z7689 Persons encountering health services in other specified circumstances: Secondary | ICD-10-CM | POA: Diagnosis not present

## 2021-11-17 DIAGNOSIS — Z7689 Persons encountering health services in other specified circumstances: Secondary | ICD-10-CM | POA: Diagnosis not present

## 2021-11-21 DIAGNOSIS — Z7151 Drug abuse counseling and surveillance of drug abuser: Secondary | ICD-10-CM | POA: Diagnosis not present

## 2021-11-21 DIAGNOSIS — G64 Other disorders of peripheral nervous system: Secondary | ICD-10-CM | POA: Diagnosis not present

## 2021-11-21 DIAGNOSIS — Z7689 Persons encountering health services in other specified circumstances: Secondary | ICD-10-CM | POA: Diagnosis not present

## 2021-11-23 DIAGNOSIS — Z7689 Persons encountering health services in other specified circumstances: Secondary | ICD-10-CM | POA: Diagnosis not present

## 2021-11-23 DIAGNOSIS — Z7151 Drug abuse counseling and surveillance of drug abuser: Secondary | ICD-10-CM | POA: Diagnosis not present

## 2021-11-24 DIAGNOSIS — Z7689 Persons encountering health services in other specified circumstances: Secondary | ICD-10-CM | POA: Diagnosis not present

## 2021-11-24 DIAGNOSIS — Z03818 Encounter for observation for suspected exposure to other biological agents ruled out: Secondary | ICD-10-CM | POA: Diagnosis not present

## 2021-11-30 DIAGNOSIS — Z7689 Persons encountering health services in other specified circumstances: Secondary | ICD-10-CM | POA: Diagnosis not present

## 2021-12-01 DIAGNOSIS — Z7151 Drug abuse counseling and surveillance of drug abuser: Secondary | ICD-10-CM | POA: Diagnosis not present

## 2021-12-04 DIAGNOSIS — Z419 Encounter for procedure for purposes other than remedying health state, unspecified: Secondary | ICD-10-CM | POA: Diagnosis not present

## 2021-12-05 DIAGNOSIS — Z7151 Drug abuse counseling and surveillance of drug abuser: Secondary | ICD-10-CM | POA: Diagnosis not present

## 2021-12-13 DIAGNOSIS — Z7151 Drug abuse counseling and surveillance of drug abuser: Secondary | ICD-10-CM | POA: Diagnosis not present

## 2021-12-14 DIAGNOSIS — Z7689 Persons encountering health services in other specified circumstances: Secondary | ICD-10-CM | POA: Diagnosis not present

## 2021-12-15 DIAGNOSIS — Z7689 Persons encountering health services in other specified circumstances: Secondary | ICD-10-CM | POA: Diagnosis not present

## 2021-12-15 DIAGNOSIS — Z7151 Drug abuse counseling and surveillance of drug abuser: Secondary | ICD-10-CM | POA: Diagnosis not present

## 2021-12-19 DIAGNOSIS — Z7689 Persons encountering health services in other specified circumstances: Secondary | ICD-10-CM | POA: Diagnosis not present

## 2021-12-21 DIAGNOSIS — Z7689 Persons encountering health services in other specified circumstances: Secondary | ICD-10-CM | POA: Diagnosis not present

## 2021-12-21 DIAGNOSIS — Z7151 Drug abuse counseling and surveillance of drug abuser: Secondary | ICD-10-CM | POA: Diagnosis not present

## 2021-12-22 DIAGNOSIS — Z03818 Encounter for observation for suspected exposure to other biological agents ruled out: Secondary | ICD-10-CM | POA: Diagnosis not present

## 2021-12-22 DIAGNOSIS — Z7689 Persons encountering health services in other specified circumstances: Secondary | ICD-10-CM | POA: Diagnosis not present

## 2021-12-28 DIAGNOSIS — Z7689 Persons encountering health services in other specified circumstances: Secondary | ICD-10-CM | POA: Diagnosis not present

## 2021-12-28 DIAGNOSIS — Z7151 Drug abuse counseling and surveillance of drug abuser: Secondary | ICD-10-CM | POA: Diagnosis not present

## 2021-12-29 DIAGNOSIS — Z7689 Persons encountering health services in other specified circumstances: Secondary | ICD-10-CM | POA: Diagnosis not present

## 2022-01-04 DIAGNOSIS — Z7689 Persons encountering health services in other specified circumstances: Secondary | ICD-10-CM | POA: Diagnosis not present

## 2022-01-04 DIAGNOSIS — Z419 Encounter for procedure for purposes other than remedying health state, unspecified: Secondary | ICD-10-CM | POA: Diagnosis not present

## 2022-01-04 DIAGNOSIS — Z7151 Drug abuse counseling and surveillance of drug abuser: Secondary | ICD-10-CM | POA: Diagnosis not present

## 2022-01-05 DIAGNOSIS — Z7689 Persons encountering health services in other specified circumstances: Secondary | ICD-10-CM | POA: Diagnosis not present

## 2022-01-11 DIAGNOSIS — Z7689 Persons encountering health services in other specified circumstances: Secondary | ICD-10-CM | POA: Diagnosis not present

## 2022-01-11 DIAGNOSIS — Z7151 Drug abuse counseling and surveillance of drug abuser: Secondary | ICD-10-CM | POA: Diagnosis not present

## 2022-01-12 DIAGNOSIS — Z7689 Persons encountering health services in other specified circumstances: Secondary | ICD-10-CM | POA: Diagnosis not present

## 2022-01-16 DIAGNOSIS — Z3042 Encounter for surveillance of injectable contraceptive: Secondary | ICD-10-CM | POA: Diagnosis not present

## 2022-01-16 DIAGNOSIS — Z7689 Persons encountering health services in other specified circumstances: Secondary | ICD-10-CM | POA: Diagnosis not present

## 2022-01-18 DIAGNOSIS — Z7689 Persons encountering health services in other specified circumstances: Secondary | ICD-10-CM | POA: Diagnosis not present

## 2022-01-19 DIAGNOSIS — Z7689 Persons encountering health services in other specified circumstances: Secondary | ICD-10-CM | POA: Diagnosis not present

## 2022-01-19 DIAGNOSIS — Z03818 Encounter for observation for suspected exposure to other biological agents ruled out: Secondary | ICD-10-CM | POA: Diagnosis not present

## 2022-01-23 DIAGNOSIS — Z7151 Drug abuse counseling and surveillance of drug abuser: Secondary | ICD-10-CM | POA: Diagnosis not present

## 2022-01-25 DIAGNOSIS — Z7689 Persons encountering health services in other specified circumstances: Secondary | ICD-10-CM | POA: Diagnosis not present

## 2022-01-26 DIAGNOSIS — Z7689 Persons encountering health services in other specified circumstances: Secondary | ICD-10-CM | POA: Diagnosis not present

## 2022-02-01 DIAGNOSIS — Z419 Encounter for procedure for purposes other than remedying health state, unspecified: Secondary | ICD-10-CM | POA: Diagnosis not present

## 2022-02-01 DIAGNOSIS — Z7689 Persons encountering health services in other specified circumstances: Secondary | ICD-10-CM | POA: Diagnosis not present

## 2022-02-02 DIAGNOSIS — Z7689 Persons encountering health services in other specified circumstances: Secondary | ICD-10-CM | POA: Diagnosis not present

## 2022-02-08 DIAGNOSIS — Z7689 Persons encountering health services in other specified circumstances: Secondary | ICD-10-CM | POA: Diagnosis not present

## 2022-02-09 DIAGNOSIS — Z7689 Persons encountering health services in other specified circumstances: Secondary | ICD-10-CM | POA: Diagnosis not present

## 2022-02-13 DIAGNOSIS — H6691 Otitis media, unspecified, right ear: Secondary | ICD-10-CM | POA: Diagnosis not present

## 2022-02-13 DIAGNOSIS — Z7689 Persons encountering health services in other specified circumstances: Secondary | ICD-10-CM | POA: Diagnosis not present

## 2022-02-15 DIAGNOSIS — Z03818 Encounter for observation for suspected exposure to other biological agents ruled out: Secondary | ICD-10-CM | POA: Diagnosis not present

## 2022-02-15 DIAGNOSIS — Z7689 Persons encountering health services in other specified circumstances: Secondary | ICD-10-CM | POA: Diagnosis not present

## 2022-02-16 DIAGNOSIS — Z7689 Persons encountering health services in other specified circumstances: Secondary | ICD-10-CM | POA: Diagnosis not present

## 2022-02-17 ENCOUNTER — Encounter: Payer: Self-pay | Admitting: Family Medicine

## 2022-02-17 ENCOUNTER — Ambulatory Visit: Payer: Medicaid Other | Admitting: Family Medicine

## 2022-02-17 VITALS — BP 131/95 | HR 79 | Temp 97.9°F | Ht 63.0 in | Wt 192.2 lb

## 2022-02-17 DIAGNOSIS — H6501 Acute serous otitis media, right ear: Secondary | ICD-10-CM

## 2022-02-17 MED ORDER — AZITHROMYCIN 250 MG PO TABS: ORAL_TABLET | ORAL | 0 refills | Status: DC

## 2022-02-17 NOTE — Progress Notes (Signed)
? ?Assessment & Plan:  ?1. Non-recurrent acute serous otitis media of right ear ?Education provided on otitis media.  ?- azithromycin (ZITHROMAX Z-PAK) 250 MG tablet; Take 2 tablets (500 mg) PO today, then 1 tablet (250 mg) PO daily x4 days.  Dispense: 6 tablet; Refill: 0 ? ? ?Follow up plan: Return if symptoms worsen or fail to improve. ? ?Hendricks Limes, MSN, APRN, FNP-C ?[place] ? ?Subjective:  ? ?Patient ID: Rebekah [name], female    DOB: 1986/11/28, 36 y.o.   MRN: [id number] ? ?HPI: ?Rebekah [name] is a 36 y.o. female presenting on 02/17/2022 for Ear Pain (Right x 1 week ) ? ?Patient reports she was seen at urgent care four days ago at which time she was diagnosed with an ear infection and prescribed Cefdinir x5 days. She reports today she feels no better. She describes her right ear as feeling like their is a cotton ball stuck in it. She is not hearing well either. She is also using Zyrtec and Flonase. ? ? ?ROS: Negative unless specifically indicated above in HPI.  ? ?Relevant past medical history reviewed and updated as indicated.  ? ?Allergies and medications reviewed and updated. ? ? ?Current Outpatient Medications:  ?  acetaminophen (TYLENOL) 500 MG tablet, Take 500 mg by mouth every 6 (six) hours as needed for mild pain or moderate pain., Disp: , Rfl:  ?  ALPRAZolam (XANAX) 1 MG tablet, Take 1 mg by mouth daily as needed., Disp: , Rfl:  ?  Biotin 10 MG TABS, Take 1 tablet by mouth daily., Disp: , Rfl:  ?  buprenorphine (SUBUTEX) 8 MG SUBL SL tablet, Place 8 mg under the tongue daily., Disp: , Rfl:  ?  cefdinir (OMNICEF) 300 MG capsule, Take 300 mg by mouth 2 (two) times daily., Disp: , Rfl:  ?  cetirizine (ZYRTEC ALLERGY) 10 MG tablet, Take 1 tablet (10 mg total) by mouth daily., Disp: 90 tablet, Rfl: 1 ?  fluticasone (FLONASE) 50 MCG/ACT nasal spray, Place 2 sprays into both nostrils daily., Disp: 16 g, Rfl: 6 ?  guaiFENesin (MUCINEX) 600 MG 12 hr tablet, Take 1 tablet (600 mg  total) by mouth 2 (two) times daily as needed., Disp: 60 tablet, Rfl: 2 ?  medroxyPROGESTERone (DEPO-PROVERA) 150 MG/ML injection, Inject 150 mg into the muscle every 3 (three) months. , Disp: , Rfl: 4 ?  methocarbamol (ROBAXIN) 500 MG tablet, Take 500 mg by mouth 4 (four) times daily., Disp: , Rfl:  ?  gabapentin (NEURONTIN) 300 MG capsule, Take 600-1,200 mg by mouth See admin instructions. Take '600mg'$  by mouth 4 times daily and take '1200mg'$  at bedtime (Patient not taking: Reported on 02/17/2022), Disp: , Rfl:  ? ?No Known Allergies ? ?Objective:  ? ?BP (!) 131/95   Pulse 79   Temp 97.9 ?F (36.6 ?C) (Temporal)   Ht '5\' 3"'$  (1.6 m)   Wt 192 lb 3.2 oz (87.2 kg)   SpO2 98%   BMI 34.05 kg/m?   ? ?Physical Exam ?Vitals reviewed.  ?Constitutional:   ?   General: She is not in acute distress. ?   Appearance: Normal appearance. She is not ill-appearing, toxic-appearing or diaphoretic.  ?HENT:  ?   Head: Normocephalic and atraumatic.  ?   Right Ear: Ear canal and external ear normal. There is no impacted cerumen. Tympanic membrane is erythematous and bulging.  ?   Left Ear: Tympanic membrane, ear canal and external ear normal. There is no impacted cerumen.  ?  Eyes:  ?   General: No scleral icterus.    ?   Right eye: No discharge.     ?   Left eye: No discharge.  ?   Conjunctiva/sclera: Conjunctivae normal.  ?Cardiovascular:  ?   Rate and Rhythm: Normal rate.  ?Pulmonary:  ?   Effort: Pulmonary effort is normal. No respiratory distress.  ?Musculoskeletal:     ?   General: Normal range of motion.  ?   Cervical back: Normal range of motion.  ?Skin: ?   General: Skin is warm and dry.  ?   Capillary Refill: Capillary refill takes less than 2 seconds.  ?Neurological:  ?   General: No focal deficit present.  ?   Mental Status: She is alert and oriented to person, place, and time. Mental status is at baseline.  ?Psychiatric:     ?   Mood and Affect: Mood normal.     ?   Behavior: Behavior normal.     ?   Thought Content: Thought  content normal.     ?   Judgment: Judgment normal.  ? ? ? ? ? ? ?

## 2022-02-22 DIAGNOSIS — Z7689 Persons encountering health services in other specified circumstances: Secondary | ICD-10-CM | POA: Diagnosis not present

## 2022-02-22 DIAGNOSIS — Z7151 Drug abuse counseling and surveillance of drug abuser: Secondary | ICD-10-CM | POA: Diagnosis not present

## 2022-02-23 DIAGNOSIS — Z7689 Persons encountering health services in other specified circumstances: Secondary | ICD-10-CM | POA: Diagnosis not present

## 2022-02-24 DIAGNOSIS — Z7151 Drug abuse counseling and surveillance of drug abuser: Secondary | ICD-10-CM | POA: Diagnosis not present

## 2022-03-01 DIAGNOSIS — Z7689 Persons encountering health services in other specified circumstances: Secondary | ICD-10-CM | POA: Diagnosis not present

## 2022-03-02 DIAGNOSIS — Z7689 Persons encountering health services in other specified circumstances: Secondary | ICD-10-CM | POA: Diagnosis not present

## 2022-03-04 DIAGNOSIS — Z419 Encounter for procedure for purposes other than remedying health state, unspecified: Secondary | ICD-10-CM | POA: Diagnosis not present

## 2022-03-08 DIAGNOSIS — Z7689 Persons encountering health services in other specified circumstances: Secondary | ICD-10-CM | POA: Diagnosis not present

## 2022-03-09 DIAGNOSIS — Z7689 Persons encountering health services in other specified circumstances: Secondary | ICD-10-CM | POA: Diagnosis not present

## 2022-03-15 DIAGNOSIS — Z7689 Persons encountering health services in other specified circumstances: Secondary | ICD-10-CM | POA: Diagnosis not present

## 2022-03-15 DIAGNOSIS — Z03818 Encounter for observation for suspected exposure to other biological agents ruled out: Secondary | ICD-10-CM | POA: Diagnosis not present

## 2022-03-16 DIAGNOSIS — Z7689 Persons encountering health services in other specified circumstances: Secondary | ICD-10-CM | POA: Diagnosis not present

## 2022-03-22 DIAGNOSIS — Z7151 Drug abuse counseling and surveillance of drug abuser: Secondary | ICD-10-CM | POA: Diagnosis not present

## 2022-03-22 DIAGNOSIS — Z7689 Persons encountering health services in other specified circumstances: Secondary | ICD-10-CM | POA: Diagnosis not present

## 2022-03-23 DIAGNOSIS — Z7689 Persons encountering health services in other specified circumstances: Secondary | ICD-10-CM | POA: Diagnosis not present
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
## 2022-03-29 DIAGNOSIS — Z7689 Persons encountering health services in other specified circumstances: Secondary | ICD-10-CM | POA: Diagnosis not present

## 2022-03-30 DIAGNOSIS — Z7689 Persons encountering health services in other specified circumstances: Secondary | ICD-10-CM | POA: Diagnosis not present

## 2022-04-03 DIAGNOSIS — Z419 Encounter for procedure for purposes other than remedying health state, unspecified: Secondary | ICD-10-CM | POA: Diagnosis not present

## 2022-04-04 DIAGNOSIS — Z3042 Encounter for surveillance of injectable contraceptive: Secondary | ICD-10-CM | POA: Diagnosis not present

## 2022-04-05 DIAGNOSIS — Z7689 Persons encountering health services in other specified circumstances: Secondary | ICD-10-CM | POA: Diagnosis not present

## 2022-04-06 DIAGNOSIS — Z7689 Persons encountering health services in other specified circumstances: Secondary | ICD-10-CM | POA: Diagnosis not present

## 2022-04-10 DIAGNOSIS — Z7689 Persons encountering health services in other specified circumstances: Secondary | ICD-10-CM | POA: Diagnosis not present

## 2022-04-12 DIAGNOSIS — Z7689 Persons encountering health services in other specified circumstances: Secondary | ICD-10-CM | POA: Diagnosis not present

## 2022-04-12 DIAGNOSIS — Z03818 Encounter for observation for suspected exposure to other biological agents ruled out: Secondary | ICD-10-CM | POA: Diagnosis not present

## 2022-04-13 ENCOUNTER — Encounter: Payer: Self-pay | Admitting: Family Medicine

## 2022-04-13 ENCOUNTER — Ambulatory Visit: Payer: Medicaid Other | Admitting: Family Medicine

## 2022-04-13 VITALS — BP 121/76 | HR 100 | Temp 97.6°F | Ht 63.0 in | Wt 193.8 lb

## 2022-04-13 DIAGNOSIS — R109 Unspecified abdominal pain: Secondary | ICD-10-CM

## 2022-04-13 DIAGNOSIS — R1032 Left lower quadrant pain: Secondary | ICD-10-CM | POA: Diagnosis not present

## 2022-04-13 DIAGNOSIS — N23 Unspecified renal colic: Secondary | ICD-10-CM

## 2022-04-13 DIAGNOSIS — Z7689 Persons encountering health services in other specified circumstances: Secondary | ICD-10-CM | POA: Diagnosis not present

## 2022-04-13 LAB — URINALYSIS, COMPLETE
Bilirubin, UA: NEGATIVE
Glucose, UA: NEGATIVE
Ketones, UA: NEGATIVE
Leukocytes,UA: NEGATIVE
Nitrite, UA: NEGATIVE
Protein,UA: NEGATIVE
Specific Gravity, UA: 1.015 (ref 1.005–1.030)
Urobilinogen, Ur: 0.2 mg/dL (ref 0.2–1.0)
pH, UA: 6 (ref 5.0–7.5)

## 2022-04-13 LAB — MICROSCOPIC EXAMINATION: Renal Epithel, UA: NONE SEEN /hpf

## 2022-04-13 MED ORDER — IBUPROFEN 800 MG PO TABS: 800.0000 mg | ORAL_TABLET | Freq: Three times a day (TID) | ORAL | 0 refills | Status: DC | PRN

## 2022-04-13 NOTE — Progress Notes (Addendum)
? ?Subjective:  ?Patient ID: Rebekah [name], female    DOB: 03-16-1986  Age: 36 y.o. MRN: [id number] ? ?CC: No chief complaint on file. ? ? ?HPI ?Rebekah [name] presents for Awoke 3 days ago with pain in the lower back on both sides. YEsterday moved to the inguinal region bilaterally. Today it is at the left groin only.  No fever. Having frequency but drinking more water. Pain is intermittent. Feels like a cramp. Also feels like a TENS pain.  ? ? ?  04/13/2022  ?  2:27 PM 04/13/2022  ?  2:18 PM 02/17/2022  ?  2:48 PM  ?Depression screen PHQ 2/9  ?Decreased Interest 2 0 2  ?Down, Depressed, Hopeless 1 0 1  ?PHQ - 2 Score 3 0 3  ?Altered sleeping 2  3  ?Tired, decreased energy 1  2  ?Change in appetite 2  2  ?Feeling bad or failure about yourself  0  1  ?Trouble concentrating 1  1  ?Moving slowly or fidgety/restless 1  0  ?Suicidal thoughts 0  0  ?PHQ-9 Score 10  12  ?Difficult doing work/chores Somewhat difficult  Somewhat difficult  ? ? ?History ?Rebekah [name] has a past medical history of Hyperlipidemia.  ? ?She has no past surgical history on file.  ? ?Her family history includes Diabetes in her father.She reports that she has been smoking cigarettes. She has a 2.50 pack-year smoking history. She has never used smokeless tobacco. She reports current alcohol use. She reports that she does not use drugs. ? ? ? ?ROS ?Review of Systems  ?Constitutional: Negative.   ?HENT: Negative.    ?Eyes:  Negative for visual disturbance.  ?Respiratory:  Negative for shortness of breath.   ?Cardiovascular:  Negative for chest pain.  ?Gastrointestinal:  Positive for abdominal pain.  ?Genitourinary:  Positive for flank pain and pelvic pain.  ?Musculoskeletal:  Negative for arthralgias.  ? ?Objective:  ?BP 121/76   Pulse 100   Temp 97.6 ?F (36.4 ?C)   Ht '5\' 3"'$  (1.6 m)   Wt 193 lb 12.8 oz (87.9 kg)   SpO2 99%   BMI 34.33 kg/m?  ? ?BP Readings from Last 3 Encounters:  ?04/13/22 121/76  ?02/17/22 (!) 131/95  ?10/13/21 129/90  ? ? ?Wt Readings  from Last 3 Encounters:  ?04/13/22 193 lb 12.8 oz (87.9 kg)  ?02/17/22 192 lb 3.2 oz (87.2 kg)  ?10/13/21 205 lb 9.6 oz (93.3 kg)  ? ? ? ?Physical Exam ?Constitutional:   ?   General: She is not in acute distress. ?   Appearance: She is well-developed.  ?Cardiovascular:  ?   Rate and Rhythm: Normal rate and regular rhythm.  ?Pulmonary:  ?   Breath sounds: Normal breath sounds.  ?Musculoskeletal:     ?   General: Normal range of motion.  ?Skin: ?   General: Skin is warm and dry.  ?Neurological:  ?   Mental Status: She is alert and oriented to person, place, and time.  ? ? ? ? ?Assessment & Plan:  ? ?Diagnoses and all orders for this visit: ? ?Flank pain ?-     Urinalysis, Complete ? ?Left lower quadrant abdominal pain ?-     CT RENAL STONE STUDY; Future ? ?Renal colic ? ?Other orders ?-     ibuprofen (ADVIL) 800 MG tablet; Take 1 tablet (800 mg total) by mouth every 8 (eight) hours as needed. ? ? ? ? ? ? ?I am having Rebekah [name]  start on ibuprofen. I am also having her maintain her medroxyPROGESTERone, acetaminophen, gabapentin, Biotin, buprenorphine, methocarbamol, ALPRAZolam, guaiFENesin, cetirizine, fluticasone, cefdinir, and azithromycin. ? ?Allergies as of 04/13/2022   ?No Known Allergies ?  ? ?  ?Medication List  ?  ? ?  ? Accurate as of Apr 13, 2022  2:55 PM. If you have any questions, ask your nurse or doctor.  ?  ?  ? ?  ? ?acetaminophen 500 MG tablet ?Commonly known as: TYLENOL ?Take 500 mg by mouth every 6 (six) hours as needed for mild pain or moderate pain. ?  ?ALPRAZolam 1 MG tablet ?Commonly known as: Duanne Moron ?Take 1 mg by mouth daily as needed. ?  ?azithromycin 250 MG tablet ?Commonly known as: Zithromax Z-Pak ?Take 2 tablets (500 mg) PO today, then 1 tablet (250 mg) PO daily x4 days. ?  ?Biotin 10 MG Tabs ?Take 1 tablet by mouth daily. ?  ?buprenorphine 8 MG Subl SL tablet ?Commonly known as: SUBUTEX ?Place 8 mg under the tongue daily. ?  ?cefdinir 300 MG capsule ?Commonly known as: OMNICEF ?Take  300 mg by mouth 2 (two) times daily. ?  ?cetirizine 10 MG tablet ?Commonly known as: ZyrTEC Allergy ?Take 1 tablet (10 mg total) by mouth daily. ?  ?fluticasone 50 MCG/ACT nasal spray ?Commonly known as: FLONASE ?Place 2 sprays into both nostrils daily. ?  ?gabapentin 300 MG capsule ?Commonly known as: NEURONTIN ?Take 600-1,200 mg by mouth See admin instructions. Take '600mg'$  by mouth 4 times daily and take '1200mg'$  at bedtime ?  ?guaiFENesin 600 MG 12 hr tablet ?Commonly known as: Mucinex ?Take 1 tablet (600 mg total) by mouth 2 (two) times daily as needed. ?  ?ibuprofen 800 MG tablet ?Commonly known as: ADVIL ?Take 1 tablet (800 mg total) by mouth every 8 (eight) hours as needed. ?Started by: Rebekah Fraise, MD ?  ?medroxyPROGESTERone 150 MG/ML injection ?Commonly known as: DEPO-PROVERA ?Inject 150 mg into the muscle every 3 (three) months. ?  ?methocarbamol 500 MG tablet ?Commonly known as: ROBAXIN ?Take 500 mg by mouth 4 (four) times daily. ?  ? ?  ? ? ? ?Follow-up: Return if symptoms worsen or fail to improve. ? ?Rebekah [name], M.D. ?

## 2022-04-13 NOTE — Addendum Note (Signed)
Addended by: Claretta Fraise on: 04/13/2022 02:55 PM ? ? Modules accepted: Orders ? ?

## 2022-04-19 ENCOUNTER — Telehealth: Payer: Self-pay | Admitting: Family Medicine

## 2022-04-19 DIAGNOSIS — Z7689 Persons encountering health services in other specified circumstances: Secondary | ICD-10-CM | POA: Diagnosis not present

## 2022-04-20 DIAGNOSIS — Z7689 Persons encountering health services in other specified circumstances: Secondary | ICD-10-CM | POA: Diagnosis not present

## 2022-04-25 NOTE — Telephone Encounter (Signed)
Patient calling to check on status of referral for CT. She stated that she is still having sxs but they are not as bad as they were when she was originally seen. Please call back and advise.

## 2022-04-26 DIAGNOSIS — Z7689 Persons encountering health services in other specified circumstances: Secondary | ICD-10-CM | POA: Diagnosis not present

## 2022-04-27 DIAGNOSIS — Z7689 Persons encountering health services in other specified circumstances: Secondary | ICD-10-CM | POA: Diagnosis not present

## 2022-04-27 NOTE — Telephone Encounter (Signed)
Attempted to contact patient - Not excepting calls

## 2022-04-27 NOTE — Telephone Encounter (Signed)
Patient called wanting update on referral.  States she was having left sided abdominal pain but now it is middle abdominal pain.  It is not as bad as it was.

## 2022-04-27 NOTE — Telephone Encounter (Signed)
Patient returning call. Please call back

## 2022-04-27 NOTE — Telephone Encounter (Signed)
Working on it, but can't get it done before she leaves town. If sx worsen go to [facility] and they will do it there.

## 2022-05-03 DIAGNOSIS — Z7151 Drug abuse counseling and surveillance of drug abuser: Secondary | ICD-10-CM | POA: Diagnosis not present

## 2022-05-03 DIAGNOSIS — Z7689 Persons encountering health services in other specified circumstances: Secondary | ICD-10-CM | POA: Diagnosis not present

## 2022-05-04 DIAGNOSIS — Z419 Encounter for procedure for purposes other than remedying health state, unspecified: Secondary | ICD-10-CM | POA: Diagnosis not present

## 2022-05-04 DIAGNOSIS — Z7689 Persons encountering health services in other specified circumstances: Secondary | ICD-10-CM | POA: Diagnosis not present

## 2022-05-05 ENCOUNTER — Ambulatory Visit (INDEPENDENT_AMBULATORY_CARE_PROVIDER_SITE_OTHER): Payer: Medicaid Other | Admitting: Family Medicine

## 2022-05-05 ENCOUNTER — Encounter: Payer: Self-pay | Admitting: Family Medicine

## 2022-05-05 DIAGNOSIS — G4452 New daily persistent headache (NDPH): Secondary | ICD-10-CM

## 2022-05-05 DIAGNOSIS — Z7151 Drug abuse counseling and surveillance of drug abuser: Secondary | ICD-10-CM | POA: Diagnosis not present

## 2022-05-05 NOTE — Progress Notes (Signed)
Virtual Visit via Telephone Note  I connected with Rebekah [name] on 05/05/22 at 10:55 AM by telephone and verified that I am speaking with the correct person using two identifiers. Rebekah [name] is currently located at home and her child is currently with her during this visit. The provider, Loman Brooklyn, FNP is located in their home at time of visit.  I discussed the limitations, risks, security and privacy concerns of performing an evaluation and management service by telephone and the availability of in person appointments. I also discussed with the patient that there may be a patient responsible charge related to this service. The patient expressed understanding and agreed to proceed.  Subjective: PCP: Loman Brooklyn, FNP  Chief Complaint  Patient presents with   Headache   Patient reports that she has daily headaches above her eyebrows that are present when she wakes up in the morning.  States this has been going on for the past few months.  States prior to this headaches were only every now and then and were relieved with Goody powders.  She has been taking the powders 4 times a day, which has not been very effective.  She has also Excedrin Migraine which was not helpful.  Denies any light or sound sensitivity.  Denies allergy symptoms.  She does drink caffeine daily, but states this has not changed.  Reports no change in her diet.  She does wear glasses and has a routine eye exam scheduled for later this month, but feels like there is nothing wrong with her vision.  She does not sleep through the night, but reports this is because she has to get up to urinate.  Her boyfriend does tell her that she snores and she does nap in the car during the day, but she has not been told she stops breathing during sleep, she is not treated for high blood pressure, her BMI is not greater than 35, her age does not over 29, and she is not the female gender.  She denies any increase in anxiety or stress,  and has Xanax to take as needed which she does not feel alters her migraines.   ROS: Per HPI  Current Outpatient Medications:    acetaminophen (TYLENOL) 500 MG tablet, Take 500 mg by mouth every 6 (six) hours as needed for mild pain or moderate pain., Disp: , Rfl:    ALPRAZolam (XANAX) 1 MG tablet, Take 1 mg by mouth daily as needed., Disp: , Rfl:    Biotin 10 MG TABS, Take 1 tablet by mouth daily., Disp: , Rfl:    buprenorphine (SUBUTEX) 8 MG SUBL SL tablet, Place 8 mg under the tongue daily., Disp: , Rfl:    cetirizine (ZYRTEC ALLERGY) 10 MG tablet, Take 1 tablet (10 mg total) by mouth daily., Disp: 90 tablet, Rfl: 1   fluticasone (FLONASE) 50 MCG/ACT nasal spray, Place 2 sprays into both nostrils daily., Disp: 16 g, Rfl: 6   gabapentin (NEURONTIN) 300 MG capsule, Take 600-1,200 mg by mouth See admin instructions. Take '600mg'$  by mouth 4 times daily and take '1200mg'$  at bedtime, Disp: , Rfl:    guaiFENesin (MUCINEX) 600 MG 12 hr tablet, Take 1 tablet (600 mg total) by mouth 2 (two) times daily as needed., Disp: 60 tablet, Rfl: 2   ibuprofen (ADVIL) 800 MG tablet, Take 1 tablet (800 mg total) by mouth every 8 (eight) hours as needed., Disp: 30 tablet, Rfl: 0   medroxyPROGESTERone (DEPO-PROVERA) 150 MG/ML injection,  Inject 150 mg into the muscle every 3 (three) months. , Disp: , Rfl: 4   methocarbamol (ROBAXIN) 500 MG tablet, Take 500 mg by mouth 4 (four) times daily., Disp: , Rfl:   No Known Allergies Past Medical History:  Diagnosis Date   Hyperlipidemia     Observations/Objective: A&O  No respiratory distress or wheezing audible over the phone Mood, judgement, and thought processes all WNL  Assessment and Plan: 1. New daily persistent headache Stop Goody powders.  Start ibuprofen 600 mg with Tylenol 500 mg every 6 hours as needed.  Education provided on headaches.  Advised to keep a diary of her symptoms, what she has eaten, what she has drank, how much she has slept, what she has been  doing, etc. encouraged to drink more water.   Follow Up Instructions: Return in about 3 weeks (around 05/26/2022) for Headaches.  I discussed the assessment and treatment plan with the patient. The patient was provided an opportunity to ask questions and all were answered. The patient agreed with the plan and demonstrated an understanding of the instructions.   The patient was advised to call back or seek an in-person evaluation if the symptoms worsen or if the condition fails to improve as anticipated.  The above assessment and management plan was discussed with the patient. The patient verbalized understanding of and has agreed to the management plan. Patient is aware to call the clinic if symptoms persist or worsen. Patient is aware when to return to the clinic for a follow-up visit. Patient educated on when it is appropriate to go to the emergency department.   Time call ended: 11:17 AM  I provided 22 minutes of non-face-to-face time during this encounter.  Hendricks Limes, MSN, APRN, FNP-C [facility] 05/05/22

## 2022-05-05 NOTE — Telephone Encounter (Signed)
Patient is still waiting to hear about her CT scan.

## 2022-05-08 DIAGNOSIS — Z7689 Persons encountering health services in other specified circumstances: Secondary | ICD-10-CM | POA: Diagnosis not present

## 2022-05-10 DIAGNOSIS — Z7689 Persons encountering health services in other specified circumstances: Secondary | ICD-10-CM | POA: Diagnosis not present

## 2022-05-10 DIAGNOSIS — Z03818 Encounter for observation for suspected exposure to other biological agents ruled out: Secondary | ICD-10-CM | POA: Diagnosis not present

## 2022-05-11 DIAGNOSIS — Z7689 Persons encountering health services in other specified circumstances: Secondary | ICD-10-CM | POA: Diagnosis not present

## 2022-05-17 ENCOUNTER — Other Ambulatory Visit: Payer: Self-pay | Admitting: Family Medicine

## 2022-05-17 DIAGNOSIS — N23 Unspecified renal colic: Secondary | ICD-10-CM

## 2022-05-17 DIAGNOSIS — R109 Unspecified abdominal pain: Secondary | ICD-10-CM

## 2022-05-17 DIAGNOSIS — Z7689 Persons encountering health services in other specified circumstances: Secondary | ICD-10-CM | POA: Diagnosis not present

## 2022-05-18 DIAGNOSIS — Z7689 Persons encountering health services in other specified circumstances: Secondary | ICD-10-CM | POA: Diagnosis not present

## 2022-05-24 DIAGNOSIS — Z7689 Persons encountering health services in other specified circumstances: Secondary | ICD-10-CM | POA: Diagnosis not present

## 2022-05-25 DIAGNOSIS — Z7689 Persons encountering health services in other specified circumstances: Secondary | ICD-10-CM | POA: Diagnosis not present

## 2022-05-26 ENCOUNTER — Ambulatory Visit (HOSPITAL_COMMUNITY)
Admission: RE | Admit: 2022-05-26 | Discharge: 2022-05-26 | Disposition: A | Discharge status: Home / Self Care | Payer: Medicaid Other | Source: Ambulatory Visit | Attending: Family Medicine | Admitting: Family Medicine

## 2022-05-26 DIAGNOSIS — R319 Hematuria, unspecified: Secondary | ICD-10-CM | POA: Insufficient documentation

## 2022-05-26 DIAGNOSIS — N23 Unspecified renal colic: Secondary | ICD-10-CM | POA: Diagnosis not present

## 2022-05-26 DIAGNOSIS — R109 Unspecified abdominal pain: Secondary | ICD-10-CM | POA: Diagnosis not present

## 2022-05-31 DIAGNOSIS — Z7689 Persons encountering health services in other specified circumstances: Secondary | ICD-10-CM | POA: Diagnosis not present

## 2022-06-01 DIAGNOSIS — Z7689 Persons encountering health services in other specified circumstances: Secondary | ICD-10-CM | POA: Diagnosis not present

## 2022-06-03 DIAGNOSIS — Z419 Encounter for procedure for purposes other than remedying health state, unspecified: Secondary | ICD-10-CM | POA: Diagnosis not present

## 2022-06-05 DIAGNOSIS — Z7151 Drug abuse counseling and surveillance of drug abuser: Secondary | ICD-10-CM | POA: Diagnosis not present

## 2022-06-07 DIAGNOSIS — Z03818 Encounter for observation for suspected exposure to other biological agents ruled out: Secondary | ICD-10-CM | POA: Diagnosis not present

## 2022-06-12 DIAGNOSIS — Z7151 Drug abuse counseling and surveillance of drug abuser: Secondary | ICD-10-CM | POA: Diagnosis not present

## 2022-06-14 DIAGNOSIS — Z7689 Persons encountering health services in other specified circumstances: Secondary | ICD-10-CM | POA: Diagnosis not present

## 2022-06-15 DIAGNOSIS — Z7689 Persons encountering health services in other specified circumstances: Secondary | ICD-10-CM | POA: Diagnosis not present

## 2022-06-19 DIAGNOSIS — Z7151 Drug abuse counseling and surveillance of drug abuser: Secondary | ICD-10-CM | POA: Diagnosis not present

## 2022-06-20 DIAGNOSIS — Z3042 Encounter for surveillance of injectable contraceptive: Secondary | ICD-10-CM | POA: Diagnosis not present

## 2022-06-21 DIAGNOSIS — Z7689 Persons encountering health services in other specified circumstances: Secondary | ICD-10-CM | POA: Diagnosis not present

## 2022-06-22 DIAGNOSIS — Z7689 Persons encountering health services in other specified circumstances: Secondary | ICD-10-CM | POA: Diagnosis not present

## 2022-06-26 DIAGNOSIS — Z7151 Drug abuse counseling and surveillance of drug abuser: Secondary | ICD-10-CM | POA: Diagnosis not present

## 2022-06-28 DIAGNOSIS — Z7689 Persons encountering health services in other specified circumstances: Secondary | ICD-10-CM | POA: Diagnosis not present

## 2022-06-29 DIAGNOSIS — Z7689 Persons encountering health services in other specified circumstances: Secondary | ICD-10-CM | POA: Diagnosis not present

## 2022-07-03 DIAGNOSIS — Z7151 Drug abuse counseling and surveillance of drug abuser: Secondary | ICD-10-CM | POA: Diagnosis not present

## 2022-07-03 DIAGNOSIS — Z7689 Persons encountering health services in other specified circumstances: Secondary | ICD-10-CM | POA: Diagnosis not present

## 2022-07-04 DIAGNOSIS — Z419 Encounter for procedure for purposes other than remedying health state, unspecified: Secondary | ICD-10-CM | POA: Diagnosis not present

## 2022-07-05 DIAGNOSIS — Z7689 Persons encountering health services in other specified circumstances: Secondary | ICD-10-CM | POA: Diagnosis not present

## 2022-07-05 DIAGNOSIS — Z03818 Encounter for observation for suspected exposure to other biological agents ruled out: Secondary | ICD-10-CM | POA: Diagnosis not present

## 2022-07-06 DIAGNOSIS — Z7689 Persons encountering health services in other specified circumstances: Secondary | ICD-10-CM | POA: Diagnosis not present

## 2022-07-10 DIAGNOSIS — Z7151 Drug abuse counseling and surveillance of drug abuser: Secondary | ICD-10-CM | POA: Diagnosis not present

## 2022-07-12 DIAGNOSIS — Z7689 Persons encountering health services in other specified circumstances: Secondary | ICD-10-CM | POA: Diagnosis not present

## 2022-07-13 DIAGNOSIS — Z7689 Persons encountering health services in other specified circumstances: Secondary | ICD-10-CM | POA: Diagnosis not present

## 2022-07-17 DIAGNOSIS — Z7151 Drug abuse counseling and surveillance of drug abuser: Secondary | ICD-10-CM | POA: Diagnosis not present

## 2022-07-19 DIAGNOSIS — Z7689 Persons encountering health services in other specified circumstances: Secondary | ICD-10-CM | POA: Diagnosis not present

## 2022-07-20 DIAGNOSIS — Z7689 Persons encountering health services in other specified circumstances: Secondary | ICD-10-CM | POA: Diagnosis not present

## 2022-07-24 DIAGNOSIS — Z7151 Drug abuse counseling and surveillance of drug abuser: Secondary | ICD-10-CM | POA: Diagnosis not present

## 2022-07-26 DIAGNOSIS — Z7689 Persons encountering health services in other specified circumstances: Secondary | ICD-10-CM | POA: Diagnosis not present

## 2022-07-27 DIAGNOSIS — Z7689 Persons encountering health services in other specified circumstances: Secondary | ICD-10-CM | POA: Diagnosis not present

## 2022-07-31 DIAGNOSIS — Z7151 Drug abuse counseling and surveillance of drug abuser: Secondary | ICD-10-CM | POA: Diagnosis not present

## 2022-07-31 DIAGNOSIS — Z7689 Persons encountering health services in other specified circumstances: Secondary | ICD-10-CM | POA: Diagnosis not present

## 2022-08-02 DIAGNOSIS — Z03818 Encounter for observation for suspected exposure to other biological agents ruled out: Secondary | ICD-10-CM | POA: Diagnosis not present

## 2022-08-02 DIAGNOSIS — Z7689 Persons encountering health services in other specified circumstances: Secondary | ICD-10-CM | POA: Diagnosis not present

## 2022-08-03 DIAGNOSIS — Z7689 Persons encountering health services in other specified circumstances: Secondary | ICD-10-CM | POA: Diagnosis not present

## 2022-08-04 DIAGNOSIS — Z419 Encounter for procedure for purposes other than remedying health state, unspecified: Secondary | ICD-10-CM | POA: Diagnosis not present

## 2022-08-09 DIAGNOSIS — Z7689 Persons encountering health services in other specified circumstances: Secondary | ICD-10-CM | POA: Diagnosis not present

## 2022-08-10 DIAGNOSIS — Z7689 Persons encountering health services in other specified circumstances: Secondary | ICD-10-CM | POA: Diagnosis not present

## 2022-08-14 DIAGNOSIS — Z7151 Drug abuse counseling and surveillance of drug abuser: Secondary | ICD-10-CM | POA: Diagnosis not present

## 2022-08-16 DIAGNOSIS — Z7689 Persons encountering health services in other specified circumstances: Secondary | ICD-10-CM | POA: Diagnosis not present

## 2022-08-17 DIAGNOSIS — Z7689 Persons encountering health services in other specified circumstances: Secondary | ICD-10-CM | POA: Diagnosis not present

## 2022-08-18 DIAGNOSIS — Z7151 Drug abuse counseling and surveillance of drug abuser: Secondary | ICD-10-CM | POA: Diagnosis not present

## 2022-08-21 DIAGNOSIS — Z7151 Drug abuse counseling and surveillance of drug abuser: Secondary | ICD-10-CM | POA: Diagnosis not present

## 2022-08-23 DIAGNOSIS — Z7689 Persons encountering health services in other specified circumstances: Secondary | ICD-10-CM | POA: Diagnosis not present

## 2022-08-24 DIAGNOSIS — Z7689 Persons encountering health services in other specified circumstances: Secondary | ICD-10-CM | POA: Diagnosis not present

## 2022-08-25 DIAGNOSIS — Z7151 Drug abuse counseling and surveillance of drug abuser: Secondary | ICD-10-CM | POA: Diagnosis not present

## 2022-08-28 DIAGNOSIS — Z7689 Persons encountering health services in other specified circumstances: Secondary | ICD-10-CM | POA: Diagnosis not present

## 2022-08-30 DIAGNOSIS — Z03818 Encounter for observation for suspected exposure to other biological agents ruled out: Secondary | ICD-10-CM | POA: Diagnosis not present

## 2022-08-30 DIAGNOSIS — Z7689 Persons encountering health services in other specified circumstances: Secondary | ICD-10-CM | POA: Diagnosis not present

## 2022-08-31 DIAGNOSIS — Z7689 Persons encountering health services in other specified circumstances: Secondary | ICD-10-CM | POA: Diagnosis not present

## 2022-09-03 DIAGNOSIS — Z419 Encounter for procedure for purposes other than remedying health state, unspecified: Secondary | ICD-10-CM | POA: Diagnosis not present

## 2022-09-04 DIAGNOSIS — Z7151 Drug abuse counseling and surveillance of drug abuser: Secondary | ICD-10-CM | POA: Diagnosis not present

## 2022-09-06 DIAGNOSIS — Z7689 Persons encountering health services in other specified circumstances: Secondary | ICD-10-CM | POA: Diagnosis not present

## 2022-09-07 DIAGNOSIS — Z7689 Persons encountering health services in other specified circumstances: Secondary | ICD-10-CM | POA: Diagnosis not present

## 2022-09-07 DIAGNOSIS — Z3042 Encounter for surveillance of injectable contraceptive: Secondary | ICD-10-CM | POA: Diagnosis not present

## 2022-09-08 DIAGNOSIS — Z7151 Drug abuse counseling and surveillance of drug abuser: Secondary | ICD-10-CM | POA: Diagnosis not present

## 2022-09-11 DIAGNOSIS — Z7151 Drug abuse counseling and surveillance of drug abuser: Secondary | ICD-10-CM | POA: Diagnosis not present

## 2022-09-13 DIAGNOSIS — Z7689 Persons encountering health services in other specified circumstances: Secondary | ICD-10-CM | POA: Diagnosis not present

## 2022-09-14 DIAGNOSIS — Z7689 Persons encountering health services in other specified circumstances: Secondary | ICD-10-CM | POA: Diagnosis not present

## 2022-09-18 DIAGNOSIS — Z7151 Drug abuse counseling and surveillance of drug abuser: Secondary | ICD-10-CM | POA: Diagnosis not present

## 2022-09-20 DIAGNOSIS — Z7689 Persons encountering health services in other specified circumstances: Secondary | ICD-10-CM | POA: Diagnosis not present

## 2022-09-21 DIAGNOSIS — Z7689 Persons encountering health services in other specified circumstances: Secondary | ICD-10-CM | POA: Diagnosis not present

## 2022-09-25 DIAGNOSIS — Z7689 Persons encountering health services in other specified circumstances: Secondary | ICD-10-CM | POA: Diagnosis not present

## 2022-09-25 DIAGNOSIS — Z7151 Drug abuse counseling and surveillance of drug abuser: Secondary | ICD-10-CM | POA: Diagnosis not present

## 2022-09-26 DIAGNOSIS — Z7151 Drug abuse counseling and surveillance of drug abuser: Secondary | ICD-10-CM | POA: Diagnosis not present

## 2022-09-27 DIAGNOSIS — Z03818 Encounter for observation for suspected exposure to other biological agents ruled out: Secondary | ICD-10-CM | POA: Diagnosis not present

## 2022-09-27 DIAGNOSIS — Z7689 Persons encountering health services in other specified circumstances: Secondary | ICD-10-CM | POA: Diagnosis not present

## 2022-09-27 DIAGNOSIS — Z7151 Drug abuse counseling and surveillance of drug abuser: Secondary | ICD-10-CM | POA: Diagnosis not present

## 2022-09-28 DIAGNOSIS — Z7689 Persons encountering health services in other specified circumstances: Secondary | ICD-10-CM | POA: Diagnosis not present

## 2022-10-04 DIAGNOSIS — Z7689 Persons encountering health services in other specified circumstances: Secondary | ICD-10-CM | POA: Diagnosis not present

## 2022-10-04 DIAGNOSIS — Z419 Encounter for procedure for purposes other than remedying health state, unspecified: Secondary | ICD-10-CM | POA: Diagnosis not present

## 2022-10-05 DIAGNOSIS — Z7689 Persons encountering health services in other specified circumstances: Secondary | ICD-10-CM | POA: Diagnosis not present

## 2022-10-06 DIAGNOSIS — Z7151 Drug abuse counseling and surveillance of drug abuser: Secondary | ICD-10-CM | POA: Diagnosis not present

## 2022-10-09 DIAGNOSIS — Z7151 Drug abuse counseling and surveillance of drug abuser: Secondary | ICD-10-CM | POA: Diagnosis not present

## 2022-10-11 DIAGNOSIS — Z7689 Persons encountering health services in other specified circumstances: Secondary | ICD-10-CM | POA: Diagnosis not present

## 2022-10-12 DIAGNOSIS — Z7689 Persons encountering health services in other specified circumstances: Secondary | ICD-10-CM | POA: Diagnosis not present

## 2022-10-16 DIAGNOSIS — Z7151 Drug abuse counseling and surveillance of drug abuser: Secondary | ICD-10-CM | POA: Diagnosis not present

## 2022-10-18 DIAGNOSIS — Z7689 Persons encountering health services in other specified circumstances: Secondary | ICD-10-CM | POA: Diagnosis not present

## 2022-10-19 DIAGNOSIS — Z7689 Persons encountering health services in other specified circumstances: Secondary | ICD-10-CM | POA: Diagnosis not present

## 2022-10-20 DIAGNOSIS — Z7151 Drug abuse counseling and surveillance of drug abuser: Secondary | ICD-10-CM | POA: Diagnosis not present

## 2022-10-23 DIAGNOSIS — Z7689 Persons encountering health services in other specified circumstances: Secondary | ICD-10-CM | POA: Diagnosis not present

## 2022-10-23 DIAGNOSIS — Z7151 Drug abuse counseling and surveillance of drug abuser: Secondary | ICD-10-CM | POA: Diagnosis not present

## 2022-10-25 DIAGNOSIS — Z7151 Drug abuse counseling and surveillance of drug abuser: Secondary | ICD-10-CM | POA: Diagnosis not present

## 2022-10-25 DIAGNOSIS — Z7689 Persons encountering health services in other specified circumstances: Secondary | ICD-10-CM | POA: Diagnosis not present

## 2022-10-26 DIAGNOSIS — Z7689 Persons encountering health services in other specified circumstances: Secondary | ICD-10-CM | POA: Diagnosis not present

## 2022-10-30 DIAGNOSIS — Z7151 Drug abuse counseling and surveillance of drug abuser: Secondary | ICD-10-CM | POA: Diagnosis not present

## 2022-11-01 DIAGNOSIS — Z7689 Persons encountering health services in other specified circumstances: Secondary | ICD-10-CM | POA: Diagnosis not present

## 2022-11-02 DIAGNOSIS — Z7689 Persons encountering health services in other specified circumstances: Secondary | ICD-10-CM | POA: Diagnosis not present

## 2022-11-03 DIAGNOSIS — Z419 Encounter for procedure for purposes other than remedying health state, unspecified: Secondary | ICD-10-CM | POA: Diagnosis not present

## 2022-11-06 DIAGNOSIS — R87612 Low grade squamous intraepithelial lesion on cytologic smear of cervix (LGSIL): Secondary | ICD-10-CM | POA: Diagnosis not present

## 2022-11-06 DIAGNOSIS — Z0000 Encounter for general adult medical examination without abnormal findings: Secondary | ICD-10-CM | POA: Diagnosis not present

## 2022-11-06 DIAGNOSIS — Z7151 Drug abuse counseling and surveillance of drug abuser: Secondary | ICD-10-CM | POA: Diagnosis not present

## 2022-11-06 DIAGNOSIS — R110 Nausea: Secondary | ICD-10-CM | POA: Diagnosis not present

## 2022-11-08 DIAGNOSIS — Z7689 Persons encountering health services in other specified circumstances: Secondary | ICD-10-CM | POA: Diagnosis not present

## 2022-11-09 DIAGNOSIS — Z7689 Persons encountering health services in other specified circumstances: Secondary | ICD-10-CM | POA: Diagnosis not present

## 2022-11-10 DIAGNOSIS — Z7151 Drug abuse counseling and surveillance of drug abuser: Secondary | ICD-10-CM | POA: Diagnosis not present

## 2022-11-13 DIAGNOSIS — Z7151 Drug abuse counseling and surveillance of drug abuser: Secondary | ICD-10-CM | POA: Diagnosis not present

## 2022-11-15 DIAGNOSIS — Z7689 Persons encountering health services in other specified circumstances: Secondary | ICD-10-CM | POA: Diagnosis not present

## 2022-11-16 DIAGNOSIS — Z7689 Persons encountering health services in other specified circumstances: Secondary | ICD-10-CM | POA: Diagnosis not present

## 2022-11-17 DIAGNOSIS — Z7151 Drug abuse counseling and surveillance of drug abuser: Secondary | ICD-10-CM | POA: Diagnosis not present

## 2022-11-20 DIAGNOSIS — Z7151 Drug abuse counseling and surveillance of drug abuser: Secondary | ICD-10-CM | POA: Diagnosis not present

## 2022-11-20 DIAGNOSIS — Z7689 Persons encountering health services in other specified circumstances: Secondary | ICD-10-CM | POA: Diagnosis not present

## 2022-11-22 DIAGNOSIS — Z7151 Drug abuse counseling and surveillance of drug abuser: Secondary | ICD-10-CM | POA: Diagnosis not present

## 2022-11-22 DIAGNOSIS — Z7689 Persons encountering health services in other specified circumstances: Secondary | ICD-10-CM | POA: Diagnosis not present

## 2022-11-23 DIAGNOSIS — Z7689 Persons encountering health services in other specified circumstances: Secondary | ICD-10-CM | POA: Diagnosis not present

## 2022-11-25 DIAGNOSIS — Z7151 Drug abuse counseling and surveillance of drug abuser: Secondary | ICD-10-CM | POA: Diagnosis not present

## 2022-11-29 DIAGNOSIS — Z7689 Persons encountering health services in other specified circumstances: Secondary | ICD-10-CM | POA: Diagnosis not present

## 2022-11-30 DIAGNOSIS — Z7689 Persons encountering health services in other specified circumstances: Secondary | ICD-10-CM | POA: Diagnosis not present

## 2022-12-01 DIAGNOSIS — Z7151 Drug abuse counseling and surveillance of drug abuser: Secondary | ICD-10-CM | POA: Diagnosis not present

## 2022-12-04 DIAGNOSIS — Z419 Encounter for procedure for purposes other than remedying health state, unspecified: Secondary | ICD-10-CM | POA: Diagnosis not present

## 2022-12-05 DIAGNOSIS — Z3042 Encounter for surveillance of injectable contraceptive: Secondary | ICD-10-CM | POA: Diagnosis not present

## 2022-12-05 DIAGNOSIS — Z7151 Drug abuse counseling and surveillance of drug abuser: Secondary | ICD-10-CM | POA: Diagnosis not present

## 2022-12-06 DIAGNOSIS — Z7689 Persons encountering health services in other specified circumstances: Secondary | ICD-10-CM | POA: Diagnosis not present

## 2022-12-07 DIAGNOSIS — Z7689 Persons encountering health services in other specified circumstances: Secondary | ICD-10-CM | POA: Diagnosis not present

## 2022-12-11 DIAGNOSIS — Z7151 Drug abuse counseling and surveillance of drug abuser: Secondary | ICD-10-CM | POA: Diagnosis not present

## 2022-12-13 DIAGNOSIS — Z7689 Persons encountering health services in other specified circumstances: Secondary | ICD-10-CM | POA: Diagnosis not present

## 2022-12-14 DIAGNOSIS — Z7689 Persons encountering health services in other specified circumstances: Secondary | ICD-10-CM | POA: Diagnosis not present

## 2022-12-15 DIAGNOSIS — Z7151 Drug abuse counseling and surveillance of drug abuser: Secondary | ICD-10-CM | POA: Diagnosis not present

## 2022-12-18 DIAGNOSIS — Z7689 Persons encountering health services in other specified circumstances: Secondary | ICD-10-CM | POA: Diagnosis not present

## 2022-12-19 ENCOUNTER — Ambulatory Visit: Payer: Medicaid Other | Admitting: Family Medicine

## 2022-12-19 ENCOUNTER — Encounter: Payer: Self-pay | Admitting: Family Medicine

## 2022-12-20 DIAGNOSIS — Z7689 Persons encountering health services in other specified circumstances: Secondary | ICD-10-CM | POA: Diagnosis not present

## 2022-12-20 DIAGNOSIS — Z7151 Drug abuse counseling and surveillance of drug abuser: Secondary | ICD-10-CM | POA: Diagnosis not present

## 2022-12-21 DIAGNOSIS — Z7689 Persons encountering health services in other specified circumstances: Secondary | ICD-10-CM | POA: Diagnosis not present

## 2022-12-21 DIAGNOSIS — R350 Frequency of micturition: Secondary | ICD-10-CM | POA: Diagnosis not present

## 2022-12-21 DIAGNOSIS — N3001 Acute cystitis with hematuria: Secondary | ICD-10-CM | POA: Diagnosis not present

## 2022-12-25 DIAGNOSIS — Z7151 Drug abuse counseling and surveillance of drug abuser: Secondary | ICD-10-CM | POA: Diagnosis not present

## 2022-12-27 DIAGNOSIS — Z7689 Persons encountering health services in other specified circumstances: Secondary | ICD-10-CM | POA: Diagnosis not present

## 2022-12-28 DIAGNOSIS — Z7689 Persons encountering health services in other specified circumstances: Secondary | ICD-10-CM | POA: Diagnosis not present

## 2023-01-01 DIAGNOSIS — Z7151 Drug abuse counseling and surveillance of drug abuser: Secondary | ICD-10-CM | POA: Diagnosis not present

## 2023-01-03 DIAGNOSIS — Z7689 Persons encountering health services in other specified circumstances: Secondary | ICD-10-CM | POA: Diagnosis not present

## 2023-01-04 DIAGNOSIS — Z7689 Persons encountering health services in other specified circumstances: Secondary | ICD-10-CM | POA: Diagnosis not present

## 2023-01-04 DIAGNOSIS — Z419 Encounter for procedure for purposes other than remedying health state, unspecified: Secondary | ICD-10-CM | POA: Diagnosis not present

## 2023-01-10 DIAGNOSIS — Z7689 Persons encountering health services in other specified circumstances: Secondary | ICD-10-CM | POA: Diagnosis not present

## 2023-01-11 DIAGNOSIS — Z7689 Persons encountering health services in other specified circumstances: Secondary | ICD-10-CM | POA: Diagnosis not present

## 2023-01-12 DIAGNOSIS — Z7151 Drug abuse counseling and surveillance of drug abuser: Secondary | ICD-10-CM | POA: Diagnosis not present

## 2023-01-15 DIAGNOSIS — Z7151 Drug abuse counseling and surveillance of drug abuser: Secondary | ICD-10-CM | POA: Diagnosis not present

## 2023-01-17 DIAGNOSIS — Z7151 Drug abuse counseling and surveillance of drug abuser: Secondary | ICD-10-CM | POA: Diagnosis not present

## 2023-01-17 DIAGNOSIS — Z7689 Persons encountering health services in other specified circumstances: Secondary | ICD-10-CM | POA: Diagnosis not present

## 2023-01-18 DIAGNOSIS — Z7689 Persons encountering health services in other specified circumstances: Secondary | ICD-10-CM | POA: Diagnosis not present

## 2023-01-22 DIAGNOSIS — Z7151 Drug abuse counseling and surveillance of drug abuser: Secondary | ICD-10-CM | POA: Diagnosis not present

## 2023-01-24 DIAGNOSIS — Z7689 Persons encountering health services in other specified circumstances: Secondary | ICD-10-CM | POA: Diagnosis not present

## 2023-01-25 DIAGNOSIS — Z7689 Persons encountering health services in other specified circumstances: Secondary | ICD-10-CM | POA: Diagnosis not present

## 2023-01-26 DIAGNOSIS — Z7151 Drug abuse counseling and surveillance of drug abuser: Secondary | ICD-10-CM | POA: Diagnosis not present

## 2023-01-29 DIAGNOSIS — Z7151 Drug abuse counseling and surveillance of drug abuser: Secondary | ICD-10-CM | POA: Diagnosis not present

## 2023-01-31 DIAGNOSIS — Z7689 Persons encountering health services in other specified circumstances: Secondary | ICD-10-CM | POA: Diagnosis not present

## 2023-02-01 DIAGNOSIS — Z7689 Persons encountering health services in other specified circumstances: Secondary | ICD-10-CM | POA: Diagnosis not present

## 2023-02-02 DIAGNOSIS — Z419 Encounter for procedure for purposes other than remedying health state, unspecified: Secondary | ICD-10-CM | POA: Diagnosis not present

## 2023-02-07 DIAGNOSIS — Z7689 Persons encountering health services in other specified circumstances: Secondary | ICD-10-CM | POA: Diagnosis not present

## 2023-02-08 DIAGNOSIS — Z7689 Persons encountering health services in other specified circumstances: Secondary | ICD-10-CM | POA: Diagnosis not present

## 2023-02-12 DIAGNOSIS — Z7151 Drug abuse counseling and surveillance of drug abuser: Secondary | ICD-10-CM | POA: Diagnosis not present

## 2023-02-12 DIAGNOSIS — Z7689 Persons encountering health services in other specified circumstances: Secondary | ICD-10-CM | POA: Diagnosis not present

## 2023-02-14 DIAGNOSIS — Z7689 Persons encountering health services in other specified circumstances: Secondary | ICD-10-CM | POA: Diagnosis not present

## 2023-02-14 DIAGNOSIS — Z7151 Drug abuse counseling and surveillance of drug abuser: Secondary | ICD-10-CM | POA: Diagnosis not present

## 2023-02-15 DIAGNOSIS — Z7689 Persons encountering health services in other specified circumstances: Secondary | ICD-10-CM | POA: Diagnosis not present

## 2023-02-19 DIAGNOSIS — Z7151 Drug abuse counseling and surveillance of drug abuser: Secondary | ICD-10-CM | POA: Diagnosis not present

## 2023-02-21 DIAGNOSIS — Z7689 Persons encountering health services in other specified circumstances: Secondary | ICD-10-CM | POA: Diagnosis not present

## 2023-02-22 ENCOUNTER — Encounter: Payer: Self-pay | Admitting: Family Medicine

## 2023-02-22 ENCOUNTER — Ambulatory Visit: Payer: Medicaid Other | Admitting: Family Medicine

## 2023-02-22 VITALS — BP 110/65 | HR 77 | Temp 98.3°F | Ht 63.0 in | Wt 205.4 lb

## 2023-02-22 DIAGNOSIS — Z720 Tobacco use: Secondary | ICD-10-CM | POA: Diagnosis not present

## 2023-02-22 DIAGNOSIS — Z716 Tobacco abuse counseling: Secondary | ICD-10-CM | POA: Diagnosis not present

## 2023-02-22 DIAGNOSIS — F1120 Opioid dependence, uncomplicated: Secondary | ICD-10-CM

## 2023-02-22 DIAGNOSIS — Z7689 Persons encountering health services in other specified circumstances: Secondary | ICD-10-CM | POA: Diagnosis not present

## 2023-02-22 DIAGNOSIS — R300 Dysuria: Secondary | ICD-10-CM

## 2023-02-22 DIAGNOSIS — B3741 Candidal cystitis and urethritis: Secondary | ICD-10-CM | POA: Diagnosis not present

## 2023-02-22 LAB — URINALYSIS, ROUTINE W REFLEX MICROSCOPIC
Bilirubin, UA: NEGATIVE
Glucose, UA: NEGATIVE
Leukocytes,UA: NEGATIVE
Nitrite, UA: NEGATIVE
Specific Gravity, UA: >1.03 — ABNORMAL HIGH (ref 1.005–1.030)
Urobilinogen, Ur: 0.2 mg/dL (ref 0.2–1.0)
pH, UA: 6 (ref 5.0–7.5)

## 2023-02-22 LAB — MICROSCOPIC EXAMINATION
Bacteria, UA: NONE SEEN
Renal Epithel, UA: NONE SEEN /hpf
WBC, UA: NONE SEEN /hpf (ref 0–5)

## 2023-02-22 MED ORDER — FLUCONAZOLE 150 MG PO TABS: ORAL_TABLET | ORAL | 0 refills | Status: DC

## 2023-02-22 MED ORDER — BUPROPION HCL ER (SR) 150 MG PO TB12: 150.0000 mg | ORAL_TABLET | Freq: Two times a day (BID) | ORAL | 1 refills | Status: DC

## 2023-02-22 MED ORDER — CEPHALEXIN 500 MG PO CAPS: 500.0000 mg | ORAL_CAPSULE | Freq: Two times a day (BID) | ORAL | 0 refills | Status: DC

## 2023-02-22 NOTE — Patient Instructions (Addendum)
Start Wellbutrin 150 mg daily for 3 days, then increase to twice a day. Wait 1 week before trying to quit. It is ok to use patches, gum etc with Wellbutrin.   Managing the Challenge of Quitting Smoking Quitting smoking is a physical and mental challenge. You may have cravings, withdrawal symptoms, and temptation to smoke. Before quitting, work with your health care provider to make a plan that can help you manage quitting. Making a plan before you quit may keep you from smoking when you have the urge to smoke while trying to quit. How to manage lifestyle changes Managing stress Stress can make you want to smoke, and wanting to smoke may cause stress. It is important to find ways to manage your stress. You could try some of the following: Practice relaxation techniques. Breathe slowly and deeply, in through your nose and out through your mouth. Listen to music. Soak in a bath or take a shower. Imagine a peaceful place or vacation. Get some support. Talk with family or friends about your stress. Join a support group. Talk with a counselor or therapist. Get some physical activity. Go for a walk, run, or bike ride. Play a favorite sport. Practice yoga.  Medicines Talk with your health care provider about medicines that might help you deal with cravings and make quitting easier for you. Relationships Social situations can be difficult when you are quitting smoking. To manage this, you can: Avoid parties and other social situations where people might be smoking. Avoid alcohol. Leave right away if you have the urge to smoke. Explain to your family and friends that you are quitting smoking. Ask for support and let them know you might be a bit grumpy. Plan activities where smoking is not an option. General instructions Be aware that many people gain weight after they quit smoking. However, not everyone does. To keep from gaining weight, have a plan in place before you quit, and stick to the  plan after you quit. Your plan should include: Eating healthy snacks. When you have a craving, it may help to: Eat popcorn, or try carrots, celery, or other cut vegetables. Chew sugar-free gum. Changing how you eat. Eat small portion sizes at meals. Eat 4-6 small meals throughout the day instead of 1-2 large meals a day. Be mindful when you eat. You should avoid watching television or doing other things that might distract you as you eat. Exercising regularly. Make time to exercise each day. If you do not have time for a long workout, do short bouts of exercise for 5-10 minutes several times a day. Do some form of strengthening exercise, such as weight lifting. Do some exercise that gets your heart beating and causes you to breathe deeply, such as walking fast, running, swimming, or biking. This is very important. Drinking plenty of water or other low-calorie or no-calorie drinks. Drink enough fluid to keep your urine pale yellow.  How to recognize withdrawal symptoms Your body and mind may experience discomfort as you try to get used to not having nicotine in your system. These effects are called withdrawal symptoms. They may include: Feeling hungrier than normal. Having trouble concentrating. Feeling irritable or restless. Having trouble sleeping. Feeling depressed. Craving a cigarette. These symptoms may surprise you, but they are normal to have when quitting smoking. To manage withdrawal symptoms: Avoid places, people, and activities that trigger your cravings. Remember why you want to quit. Get plenty of sleep. Avoid coffee and other drinks that contain caffeine. These may  worsen some of your symptoms. How to manage cravings Come up with a plan for how to deal with your cravings. The plan should include the following: A definition of the specific situation you want to deal with. An activity or action you will take to replace smoking. A clear idea for how this action will  help. The name of someone who could help you with this. Cravings usually last for 5-10 minutes. Consider taking the following actions to help you with your plan to deal with cravings: Keep your mouth busy. Chew sugar-free gum. Suck on hard candies or a straw. Brush your teeth. Keep your hands and body busy. Change to a different activity right away. Squeeze or play with a ball. Do an activity or a hobby, such as making bead jewelry, practicing needlepoint, or working with wood. Mix up your normal routine. Take a short exercise break. Go for a quick walk, or run up and down stairs. Focus on doing something kind or helpful for someone else. Call a friend or family member to talk during a craving. Join a support group. Contact a quitline. Where to find support To get help or find a support group: Call the [employer]'s Smoking Quitline: 1-800-QUIT-NOW 216-472-9458) Text QUIT to SmokefreeTXTMQ:317211 Where to find more information Visit these websites to find more information on quitting smoking: U.S. Department of Health and Human Services: www.smokefree.gov American Lung Association: www.freedomfromsmoking.org Centers for Disease Control and Prevention (CDC): http://www.wolf.info/ American Heart Association: www.heart.org Contact a health care provider if: You want to change your plan for quitting. The medicines you are taking are not helping. Your eating feels out of control or you cannot sleep. You feel depressed or become very anxious. Summary Quitting smoking is a physical and mental challenge. You will face cravings, withdrawal symptoms, and temptation to smoke again. Preparation can help you as you go through these challenges. Try different techniques to manage stress, handle social situations, and prevent weight gain. You can deal with cravings by keeping your mouth busy (such as by chewing gum), keeping your hands and body busy, calling family or friends, or contacting a  quitline for people who want to quit smoking. You can deal with withdrawal symptoms by avoiding places where people smoke, getting plenty of rest, and avoiding drinks that contain caffeine. This information is not intended to replace advice given to you by your health care provider. Make sure you discuss any questions you have with your health care provider. Document Revised: 11/11/2021 Document Reviewed: 11/11/2021 Elsevier Patient Education  [place].

## 2023-02-22 NOTE — Progress Notes (Signed)
Established Patient Office Visit  Subjective   Patient ID: Rebekah [name], female    DOB: [birth year]  Age: 37 y.o. MRN: [id number]  Chief Complaint  Patient presents with   Smoking Cessation Screening   Dysuria    Dysuria  This is a new problem. The current episode started yesterday. The problem occurs every urination. The quality of the pain is described as burning. There has been no fever. She is Sexually active. Associated symptoms include frequency, hesitancy and urgency. Pertinent negatives include no chills, discharge, flank pain, hematuria, nausea, possible pregnancy, sweats or vomiting. She has tried antibiotics (left over amoxicillin) for the symptoms. The treatment provided moderate relief. Her past medical history is significant for kidney stones. There is no history of recurrent UTIs.    She is currently on subutux. This is managed by [facility] in [place]. She has been on this for 6 years following narcotic abuse.   She is interested in quitting smoking. She has smoked a 1/2 PPD for about 20 years. She has recently cut back some. She has tried patches, gum, and vapes in the past without. She has been on Wellbutrin in the past for anxiety, depression but not for smoking cessation.   Past Medical History:  Diagnosis Date   Hyperlipidemia       Review of Systems  Constitutional:  Negative for chills.  Gastrointestinal:  Negative for nausea and vomiting.  Genitourinary:  Positive for dysuria, frequency, hesitancy and urgency. Negative for flank pain and hematuria.      Objective:     BP 110/65   Pulse 77   Temp 98.3 F (36.8 C) (Temporal)   Ht 5\' 3"  (1.6 m)   Wt 205 lb 6 oz (93.2 kg)   SpO2 96%   BMI 36.38 kg/m    Physical Exam Vitals and nursing note reviewed.  Constitutional:      General: She is not in acute distress.    Appearance: She is obese. She is not ill-appearing, toxic-appearing or diaphoretic.  Cardiovascular:     Rate and Rhythm: Normal  rate and regular rhythm.     Heart sounds: Normal heart sounds. No murmur heard. Pulmonary:     Effort: Pulmonary effort is normal.     Breath sounds: Normal breath sounds.  Abdominal:     General: Bowel sounds are normal. There is no distension.     Palpations: Abdomen is soft.     Tenderness: There is no abdominal tenderness. There is no guarding or rebound.  Musculoskeletal:     Cervical back: Neck supple. No rigidity.     Right lower leg: No edema.     Left lower leg: No edema.  Skin:    General: Skin is warm and dry.  Neurological:     General: No focal deficit present.     Mental Status: She is alert and oriented to person, place, and time.  Psychiatric:        Mood and Affect: Mood normal.        Behavior: Behavior normal.      No results found for any visits on 02/22/23.    The ASCVD Risk score (Arnett DK, et al., 2019) failed to calculate for the following reasons:   The 2019 ASCVD risk score is only valid for ages 70 to 83    Assessment & Plan:   Ambriel was seen today for smoking cessation screening and dysuria.  Diagnoses and all orders for this visit:  Dysuria Will  treat empirically with keflex pending culture.  -     Urinalysis, Routine w reflex microscopic -     cephALEXin (KEFLEX) 500 MG capsule; Take 1 capsule (500 mg total) by mouth 2 (two) times daily. -     Urine Culture  Yeast cystitis Diflucan as below.  -     fluconazole (DIFLUCAN) 150 MG tablet; Take one tablet my mouth now and repeat in 3 days.  Tobacco abuse Tobacco abuse counseling Discussed cessation. Start wellbutrin. Will follow up in 6 weeks.  -     buPROPion (WELLBUTRIN SR) 150 MG 12 hr tablet; Take 1 tablet (150 mg total) by mouth 2 (two) times daily.  Opioid dependence on agonist therapy ([facility]) On subutex by [facility].    Return in about 6 weeks (around 04/05/2023) for smoking cessation.   The patient indicates understanding of these issues and agrees with the  plan.  Gwenlyn Perking, FNP

## 2023-02-23 DIAGNOSIS — Z7151 Drug abuse counseling and surveillance of drug abuser: Secondary | ICD-10-CM | POA: Diagnosis not present

## 2023-02-23 LAB — URINE CULTURE: Organism ID, Bacteria: NO GROWTH

## 2023-02-28 DIAGNOSIS — Z3042 Encounter for surveillance of injectable contraceptive: Secondary | ICD-10-CM | POA: Diagnosis not present

## 2023-02-28 DIAGNOSIS — Z7689 Persons encountering health services in other specified circumstances: Secondary | ICD-10-CM | POA: Diagnosis not present

## 2023-03-01 DIAGNOSIS — Z7689 Persons encountering health services in other specified circumstances: Secondary | ICD-10-CM | POA: Diagnosis not present

## 2023-03-05 DIAGNOSIS — Z7151 Drug abuse counseling and surveillance of drug abuser: Secondary | ICD-10-CM | POA: Diagnosis not present

## 2023-03-05 DIAGNOSIS — Z419 Encounter for procedure for purposes other than remedying health state, unspecified: Secondary | ICD-10-CM | POA: Diagnosis not present

## 2023-03-08 DIAGNOSIS — Z7689 Persons encountering health services in other specified circumstances: Secondary | ICD-10-CM | POA: Diagnosis not present

## 2023-03-12 DIAGNOSIS — Z7151 Drug abuse counseling and surveillance of drug abuser: Secondary | ICD-10-CM | POA: Diagnosis not present

## 2023-03-14 DIAGNOSIS — Z7151 Drug abuse counseling and surveillance of drug abuser: Secondary | ICD-10-CM | POA: Diagnosis not present

## 2023-03-14 DIAGNOSIS — Z7689 Persons encountering health services in other specified circumstances: Secondary | ICD-10-CM | POA: Diagnosis not present

## 2023-03-15 DIAGNOSIS — Z7689 Persons encountering health services in other specified circumstances: Secondary | ICD-10-CM | POA: Diagnosis not present

## 2023-03-19 DIAGNOSIS — Z7689 Persons encountering health services in other specified circumstances: Secondary | ICD-10-CM | POA: Diagnosis not present

## 2023-03-19 DIAGNOSIS — Z7151 Drug abuse counseling and surveillance of drug abuser: Secondary | ICD-10-CM | POA: Diagnosis not present

## 2023-03-21 DIAGNOSIS — Z7689 Persons encountering health services in other specified circumstances: Secondary | ICD-10-CM | POA: Diagnosis not present

## 2023-03-23 DIAGNOSIS — Z7151 Drug abuse counseling and surveillance of drug abuser: Secondary | ICD-10-CM | POA: Diagnosis not present

## 2023-03-26 DIAGNOSIS — Z7151 Drug abuse counseling and surveillance of drug abuser: Secondary | ICD-10-CM | POA: Diagnosis not present

## 2023-04-02 DIAGNOSIS — Z7151 Drug abuse counseling and surveillance of drug abuser: Secondary | ICD-10-CM | POA: Diagnosis not present

## 2023-04-04 DIAGNOSIS — Z419 Encounter for procedure for purposes other than remedying health state, unspecified: Secondary | ICD-10-CM | POA: Diagnosis not present

## 2023-04-06 ENCOUNTER — Ambulatory Visit (INDEPENDENT_AMBULATORY_CARE_PROVIDER_SITE_OTHER): Payer: Medicaid Other | Admitting: Family Medicine

## 2023-04-06 ENCOUNTER — Encounter: Payer: Self-pay | Admitting: Family Medicine

## 2023-04-06 VITALS — BP 131/85 | HR 87 | Temp 98.4°F | Ht 63.0 in | Wt 205.5 lb

## 2023-04-06 DIAGNOSIS — Z716 Tobacco abuse counseling: Secondary | ICD-10-CM

## 2023-04-06 DIAGNOSIS — L309 Dermatitis, unspecified: Secondary | ICD-10-CM | POA: Diagnosis not present

## 2023-04-06 DIAGNOSIS — Z720 Tobacco use: Secondary | ICD-10-CM

## 2023-04-06 MED ORDER — CLOBETASOL PROPIONATE 0.05 % EX CREA: 1.0000 | TOPICAL_CREAM | Freq: Two times a day (BID) | CUTANEOUS | 0 refills | Status: DC

## 2023-04-06 MED ORDER — VARENICLINE TARTRATE 0.5 MG PO TABS: ORAL_TABLET | ORAL | 0 refills | Status: DC

## 2023-04-06 MED ORDER — VARENICLINE TARTRATE 1 MG PO TABS: 1.0000 mg | ORAL_TABLET | Freq: Two times a day (BID) | ORAL | 0 refills | Status: DC

## 2023-04-06 NOTE — Patient Instructions (Signed)
Varenicline Tablets What is this medication? VARENICLINE (var e NI kleen) helps you quit smoking. It reduces cravings for nicotine, the addictive substance found in tobacco. It is most effective when used in combination with a stop-smoking program. This medicine may be used for other purposes; ask your health care provider or pharmacist if you have questions. COMMON BRAND NAME(S): Chantix What should I tell my care team before I take this medication? They need to know if you have any of these conditions: Heart disease Frequently drink alcohol Kidney disease Mental health condition On hemodialysis Seizures History of stroke Suicidal thoughts, plans, or attempt by you or a family member An unusual or allergic reaction to varenicline, other medications, foods, dyes, or preservatives Pregnant or trying to get pregnant Breast-feeding How should I use this medication? Take this medication by mouth after eating. Take with a full glass of water. Follow the directions on the prescription label. Take your doses at regular intervals. Do not take your medication more often than directed. There are 3 ways you can use this medication to help you quit smoking; talk to your care team to decide which plan is right for you: 1) you can choose a quit date and start this medication 1 week before the quit date, or, 2) you can start taking this medication before you choose a quit date, and then pick a quit date between day 8 and 35 days of treatment, or, 3) if you are not sure that you are able or willing to quit smoking right away, start taking this medication and slowly decrease the amount you smoke as directed by your care team with the goal of being cigarette-free by week 12 of treatment. Stick to your plan; ask about support groups or other ways to help you remain cigarette-free. If you are motivated to quit smoking and did not succeed during a previous attempt with this medication for reasons other than side  effects, or if you returned to smoking after this treatment, speak with your care team about whether another course of this medication may be right for you. A special MedGuide will be given to you by the pharmacist with each prescription and refill. Be sure to read this information carefully each time. Talk to your care team about the use of this medication in children. This medication is not approved for use in children. Overdosage: If you think you have taken too much of this medicine contact a poison control center or emergency room at once. NOTE: This medicine is only for you. Do not share this medicine with others. What if I miss a dose? If you miss a dose, take it as soon as you can. If it is almost time for your next dose, take only that dose. Do not take double or extra doses. What may interact with this medication? Alcohol Insulin Other medications used to help people quit smoking Theophylline Warfarin This list may not describe all possible interactions. Give your health care provider a list of all the medicines, herbs, non-prescription drugs, or dietary supplements you use. Also tell them if you smoke, drink alcohol, or use illegal drugs. Some items may interact with your medicine. What should I watch for while using this medication? It is okay if you do not succeed at your attempt to quit and have a cigarette. You can still continue your quit attempt and keep using this medication as directed. Just throw away your cigarettes and get back to your quit plan. Talk to your care team   before using other treatments to quit smoking. Using this medication with other treatments to quit smoking may increase the risk for side effects compared to using a treatment alone. This medication may affect your coordination, reaction time, or judgment. Do not drive or operate machinery until you know how this medication affects you. Sit up or stand slowly to reduce the risk of dizzy or fainting  spells. Decrease the number of alcoholic beverages that you drink during treatment with this medication until you know if this medication affects your ability to tolerate alcohol. Some people have experienced increased drunkenness (intoxication), unusual or sometimes aggressive behavior, or no memory of things that have happened (amnesia) during treatment with this medication. You may do unusual sleep behaviors or activities you do not remember the day after taking this medication. Activities include driving, making or eating food, talking on the phone, sexual activity, or sleep walking. Stop taking this medication and call your care team right away if you find out you have done activities like this. Patients and their families should watch out for new or worsening depression or thoughts of suicide. Also watch out for sudden changes in feelings such as feeling anxious, agitated, panicky, irritable, hostile, aggressive, impulsive, severely restless, overly excited and hyperactive, or not being able to sleep. If this happens, call your care team. If you have diabetes, and you quit smoking, the effects of insulin may be increased. You may need to reduce your insulin dose. Check with your care team about how you should adjust your insulin dose. What side effects may I notice from receiving this medication? Side effects that you should report to your care team as soon as possible: Allergic reactions or angioedema--skin rash, itching or hives, swelling of the face, eyes, lips, tongue, arms, or legs, trouble swallowing or breathing Heart attack--pain or tightness in the chest, shoulders, arms, or jaw, nausea, shortness of breath, cold or clammy skin, feeling faint or lightheaded Mood and behavior changes--anxiety, nervousness, confusion, hallucinations, irritability, hostility, thoughts of suicide or self-harm, worsening mood, feelings of depression Redness, blistering, peeling, or loosening of the skin,  including inside the mouth Stroke--sudden numbness or weakness of the face, arm, or leg, trouble speaking, confusion, trouble walking, loss of balance or coordination, dizziness, severe headache, change in vision Seizures Side effects that usually do not require medical attention (report to your care team if they continue or are bothersome): Constipation Drowsiness Gas Nausea Trouble sleeping Upset stomach Vivid dreams or nightmares Vomiting This list may not describe all possible side effects. Call your doctor for medical advice about side effects. You may report side effects to FDA at 1-800-FDA-1088. Where should I keep my medication? Keep out of the reach of children and pets. Store at room temperature between 15 and 30 degrees C (59 and 86 degrees F). Throw away any unused medication after the expiration date. NOTE: This sheet is a summary. It may not cover all possible information. If you have questions about this medicine, talk to your doctor, pharmacist, or health care provider.  2023 Elsevier/Gold Standard (2021-10-12 00:00:00)  

## 2023-04-06 NOTE — Progress Notes (Signed)
   Acute Office Visit  Subjective:     Patient ID: Rebekah [name], female    DOB: 02/04/1986, 37 y.o.   MRN: [id number]  Chief Complaint  Patient presents with   Nicotine Dependence    Nicotine Dependence Presents for follow-up visit. Her urge triggers include company of smokers. The symptoms have been improving. Her first smoke is from 8 to 10 AM. Number of cigarettes per day: 4-5 cigerattes a day. Compliance with prior treatments has been poor.  She stopped wellbutrin as it was not effective. Nicotine replacements have not been effective in the past. Has not tired chantix.   She also has a rash to her RLE. It is red,bumpy, and itchy. It has been present for weeks. She has been using OTC hydrocortisone cream with some improvement. No exudate, fever, or chills. Has not spread.        ROS As per HPI.      Objective:    BP 131/85   Pulse 87   Temp 98.4 F (36.9 C) (Temporal)   Ht 5\' 3"  (1.6 m)   Wt 205 lb 8 oz (93.2 kg)   SpO2 97%   BMI 36.40 kg/m    Physical Exam Vitals and nursing note reviewed.  Constitutional:      General: She is not in acute distress.    Appearance: Normal appearance. She is not ill-appearing, toxic-appearing or diaphoretic.  Cardiovascular:     Rate and Rhythm: Normal rate and regular rhythm.     Heart sounds: Normal heart sounds. No murmur heard. Pulmonary:     Effort: Pulmonary effort is normal. No respiratory distress.     Breath sounds: Normal breath sounds.  Skin:    General: Skin is warm and dry.     Findings: Rash (erythematous rash to anterior RLE. No exudate, tenderness, or warmth.) present.  Neurological:     General: No focal deficit present.     Mental Status: She is alert and oriented to person, place, and time.  Psychiatric:        Mood and Affect: Mood normal.        Behavior: Behavior normal.     No results found for any visits on 04/06/23.      Assessment & Plan:   Gennie was seen today for nicotine  dependence.  Diagnoses and all orders for this visit:  Encounter for smoking cessation counseling Tobacco abuse Has failed wellbutrin and nicotine replacement. Chantix discussed and ordered. Discussed potential side effects.  -     varenicline (CHANTIX) 0.5 MG tablet; Days 1 to 3: take 0.5 mg by mouth once daily. Days 4 to 7: take 0.5 mg by mouth twice daily. -     varenicline (CHANTIX) 1 MG tablet; Take 1 tablet (1 mg total) by mouth 2 (two) times daily.  Dermatitis Return to office for new or worsening symptoms, or if symptoms persist.  -     clobetasol cream (TEMOVATE) 0.05 %; Apply 1 Application topically 2 (two) times daily.   Return in about 6 weeks (around 05/18/2023) for smoking follow up.  The patient indicates understanding of these issues and agrees with the plan.  Gabriel Earing, FNP

## 2023-04-09 DIAGNOSIS — Z7151 Drug abuse counseling and surveillance of drug abuser: Secondary | ICD-10-CM | POA: Diagnosis not present

## 2023-04-11 DIAGNOSIS — Z7151 Drug abuse counseling and surveillance of drug abuser: Secondary | ICD-10-CM | POA: Diagnosis not present

## 2023-04-16 DIAGNOSIS — Z7151 Drug abuse counseling and surveillance of drug abuser: Secondary | ICD-10-CM | POA: Diagnosis not present

## 2023-04-23 DIAGNOSIS — Z7151 Drug abuse counseling and surveillance of drug abuser: Secondary | ICD-10-CM | POA: Diagnosis not present

## 2023-05-02 DIAGNOSIS — F4322 Adjustment disorder with anxiety: Secondary | ICD-10-CM | POA: Diagnosis not present

## 2023-05-05 DIAGNOSIS — Z419 Encounter for procedure for purposes other than remedying health state, unspecified: Secondary | ICD-10-CM | POA: Diagnosis not present

## 2023-05-08 DIAGNOSIS — F4322 Adjustment disorder with anxiety: Secondary | ICD-10-CM | POA: Diagnosis not present

## 2023-05-10 DIAGNOSIS — H5213 Myopia, bilateral: Secondary | ICD-10-CM | POA: Diagnosis not present

## 2023-05-15 DIAGNOSIS — F4322 Adjustment disorder with anxiety: Secondary | ICD-10-CM | POA: Diagnosis not present

## 2023-05-16 DIAGNOSIS — Z3042 Encounter for surveillance of injectable contraceptive: Secondary | ICD-10-CM | POA: Diagnosis not present

## 2023-05-17 ENCOUNTER — Ambulatory Visit (INDEPENDENT_AMBULATORY_CARE_PROVIDER_SITE_OTHER): Payer: Medicaid Other | Admitting: Family Medicine

## 2023-05-17 ENCOUNTER — Encounter: Payer: Self-pay | Admitting: Family Medicine

## 2023-05-17 VITALS — BP 114/74 | HR 86 | Temp 98.1°F | Ht 63.0 in | Wt 201.0 lb

## 2023-05-17 DIAGNOSIS — F17211 Nicotine dependence, cigarettes, in remission: Secondary | ICD-10-CM | POA: Diagnosis not present

## 2023-05-17 DIAGNOSIS — R110 Nausea: Secondary | ICD-10-CM

## 2023-05-17 MED ORDER — OMEPRAZOLE 20 MG PO CPDR: 20.0000 mg | DELAYED_RELEASE_CAPSULE | Freq: Every day | ORAL | 3 refills | Status: DC

## 2023-05-17 MED ORDER — ONDANSETRON HCL 4 MG PO TABS: 4.0000 mg | ORAL_TABLET | Freq: Three times a day (TID) | ORAL | 0 refills | Status: DC | PRN

## 2023-05-17 NOTE — Progress Notes (Signed)
Established Patient Office Visit  Subjective   Patient ID: Rebekah [name], female    DOB: 17-Jan-1986  Age: 37 y.o. MRN: [id number]  Chief Complaint  Patient presents with   Follow-up    F/u meds/refill    HPI Rebekah [name] is here for a follow up for tobacco cessation. She has been tobacco free for 2 weeks now. She stopped taking chantix last week. She reports doing well with this.   She reports chronic nausea in the mornings until about lunch time. She has NBNB vomiting a few times a week typically, though this has occurred each morning recently. She denies nausea after eating. Denies water brash, abdominal pain, dysphagia, unintentional weight loss, blood in stool. She has been taking zofran with good relief. Cycles have be regular and she has not missed any doses of OCPs. She has a history of GERD. She has been referred to GI in the past but has been too afraid to actually go to the appointment.    Past Medical History:  Diagnosis Date   Hyperlipidemia       ROS As per HPI.    Objective:     BP 114/74   Pulse 86   Temp 98.1 F (36.7 C) (Oral)   Ht 5\' 3"  (1.6 m)   Wt 201 lb (91.2 kg)   LMP  (Approximate)   SpO2 97%   BMI 35.61 kg/m  Wt Readings from Last 3 Encounters:  05/17/23 201 lb (91.2 kg)  04/06/23 205 lb 8 oz (93.2 kg)  02/22/23 205 lb 6 oz (93.2 kg)      Physical Exam Vitals and nursing note reviewed.  Constitutional:      General: She is not in acute distress.    Appearance: She is obese. She is not ill-appearing, toxic-appearing or diaphoretic.  Cardiovascular:     Rate and Rhythm: Normal rate and regular rhythm.     Pulses: Normal pulses.     Heart sounds: Normal heart sounds. No murmur heard. Pulmonary:     Effort: Pulmonary effort is normal. No respiratory distress.     Breath sounds: Normal breath sounds.  Abdominal:     General: Bowel sounds are normal. There is no distension.     Palpations: Abdomen is soft. There is no mass.     Tenderness:  There is no abdominal tenderness. There is no guarding or rebound.  Musculoskeletal:     Right lower leg: No edema.     Left lower leg: No edema.  Skin:    General: Skin is warm and dry.  Neurological:     General: No focal deficit present.     Mental Status: She is alert and oriented to person, place, and time.  Psychiatric:        Mood and Affect: Mood normal.        Behavior: Behavior normal.      No results found for any visits on 05/17/23.    The ASCVD Risk score (Arnett DK, et al., 2019) failed to calculate for the following reasons:   The 2019 ASCVD risk score is only valid for ages 7 to 22    Assessment & Plan:   Rebekah [name] was seen today for follow-up.  Diagnoses and all orders for this visit:  Tobacco dependence due to cigarettes, in remission Smoking cessation instruction/counseling given:  commended patient for quitting and reviewed strategies for preventing relapses  Nausea Chronic. Refill of zofran provided. She is hesitant regarding GI referral. Suspect GERD given  hx of this. Start prilosec. Follow up in 6 weeks. Discussed referral if not resolved by then or for worsening of symptoms. Will also check labs at next appt.  -     ondansetron (ZOFRAN) 4 MG tablet; Take 1 tablet (4 mg total) by mouth every 8 (eight) hours as needed for nausea or vomiting. -     omeprazole (PRILOSEC) 20 MG capsule; Take 1 capsule (20 mg total) by mouth daily.   Return in about 6 weeks (around 06/28/2023) for nausea follow up.   The patient indicates understanding of these issues and agrees with the plan.  Gabriel Earing, FNP

## 2023-05-22 DIAGNOSIS — F4322 Adjustment disorder with anxiety: Secondary | ICD-10-CM | POA: Diagnosis not present

## 2023-05-23 DIAGNOSIS — H52223 Regular astigmatism, bilateral: Secondary | ICD-10-CM | POA: Diagnosis not present

## 2023-05-23 DIAGNOSIS — H5203 Hypermetropia, bilateral: Secondary | ICD-10-CM | POA: Diagnosis not present

## 2023-05-24 DIAGNOSIS — F4322 Adjustment disorder with anxiety: Secondary | ICD-10-CM | POA: Diagnosis not present

## 2023-05-29 DIAGNOSIS — F4322 Adjustment disorder with anxiety: Secondary | ICD-10-CM | POA: Diagnosis not present

## 2023-06-04 DIAGNOSIS — F4322 Adjustment disorder with anxiety: Secondary | ICD-10-CM | POA: Diagnosis not present

## 2023-06-04 DIAGNOSIS — Z419 Encounter for procedure for purposes other than remedying health state, unspecified: Secondary | ICD-10-CM | POA: Diagnosis not present

## 2023-06-05 DIAGNOSIS — F4322 Adjustment disorder with anxiety: Secondary | ICD-10-CM | POA: Diagnosis not present

## 2023-06-12 DIAGNOSIS — F4322 Adjustment disorder with anxiety: Secondary | ICD-10-CM | POA: Diagnosis not present

## 2023-06-13 DIAGNOSIS — F4322 Adjustment disorder with anxiety: Secondary | ICD-10-CM | POA: Diagnosis not present

## 2023-06-19 DIAGNOSIS — F4322 Adjustment disorder with anxiety: Secondary | ICD-10-CM | POA: Diagnosis not present

## 2023-06-28 DIAGNOSIS — F4322 Adjustment disorder with anxiety: Secondary | ICD-10-CM | POA: Diagnosis not present

## 2023-06-29 ENCOUNTER — Ambulatory Visit: Payer: Medicaid Other | Admitting: Family Medicine

## 2023-06-29 VITALS — BP 113/77 | HR 82 | Wt 201.0 lb

## 2023-06-29 DIAGNOSIS — K219 Gastro-esophageal reflux disease without esophagitis: Secondary | ICD-10-CM

## 2023-06-29 DIAGNOSIS — R300 Dysuria: Secondary | ICD-10-CM

## 2023-06-29 DIAGNOSIS — F411 Generalized anxiety disorder: Secondary | ICD-10-CM

## 2023-06-29 DIAGNOSIS — F410 Panic disorder [episodic paroxysmal anxiety] without agoraphobia: Secondary | ICD-10-CM

## 2023-06-29 DIAGNOSIS — F339 Major depressive disorder, recurrent, unspecified: Secondary | ICD-10-CM

## 2023-06-29 LAB — URINALYSIS, ROUTINE W REFLEX MICROSCOPIC
Bilirubin, UA: NEGATIVE
Glucose, UA: NEGATIVE
Ketones, UA: NEGATIVE
Leukocytes,UA: NEGATIVE
Nitrite, UA: NEGATIVE
Protein,UA: NEGATIVE
Specific Gravity, UA: >1.03 — ABNORMAL HIGH (ref 1.005–1.030)
Urobilinogen, Ur: 0.2 mg/dL (ref 0.2–1.0)
pH, UA: 5.5 (ref 5.0–7.5)

## 2023-06-29 LAB — MICROSCOPIC EXAMINATION
Bacteria, UA: NONE SEEN
Renal Epithel, UA: NONE SEEN /hpf
WBC, UA: NONE SEEN /hpf (ref 0–5)

## 2023-06-29 MED ORDER — HYDROXYZINE HCL 10 MG PO TABS: 10.0000 mg | ORAL_TABLET | Freq: Three times a day (TID) | ORAL | 0 refills | Status: DC | PRN

## 2023-06-29 NOTE — Progress Notes (Unsigned)
Established Patient Office Visit  Subjective   Patient ID: Rebekah [name], female    DOB: Jul 30, 1986  Age: 37 y.o. MRN: [id number]  Chief Complaint  Patient presents with   Medical Management of Chronic Issues   Nausea    Improved with zofran and omeprazole    HPI  GERD Compliant with medications - Yes Current medications - prilosec  Sore throat - No Voice change - No Hemoptysis - No Dysphagia or dyspepsia - No Water brash - No Red Flags (weight loss, hematochezia, melena, weight loss, early satiety, fevers, odynophagia, or persistent vomiting) - vomiting has improved but she is still having vomiting 1x a week or so  2. Dysuria Mild dysuria intermittently for a few weeks. Also reports some urgency and frequency. Denies vaginal discharge. Denies flank pain, back pain, abdominal pain, hematuria, fever chills. Hx of kidney stone years ago.   3. Anxiety Reports that her anxiety has been increasing. She is having some panic attacks with chest tightness, shaking. Panic is often related to driving over bridges. Previously on xanax with good control. She has failed Wellbutrin, lexapro, zoloft without success in the past. She would like to restart xanax. Currently weaning off of her suboxone and has her last prescription for this. She is in counseling and has been working on meditation.      06/29/2023    2:40 PM 05/17/2023    2:35 PM 04/06/2023   11:42 AM  Depression screen PHQ 2/9  Decreased Interest 2 1 1   Down, Depressed, Hopeless 2 2 2   PHQ - 2 Score 4 3 3   Altered sleeping 2 3 1   Tired, decreased energy 3 2 0  Change in appetite 3 2 2   Feeling bad or failure about yourself  1 1 1   Trouble concentrating 1 0 1  Moving slowly or fidgety/restless 0 0 0  Suicidal thoughts 0 0 0  PHQ-9 Score 14 11 8   Difficult doing work/chores Very difficult Somewhat difficult Somewhat difficult      06/29/2023    2:40 PM 05/17/2023    2:35 PM 04/06/2023   11:44 AM 02/22/2023   10:39 AM  GAD 7  : Generalized Anxiety Score  Nervous, Anxious, on Edge 3 2 1 2   Control/stop worrying 3 2 1 2   Worry too much - different things 3 2 1 2   Trouble relaxing 3 1 1 2   Restless 2 0 0 1  Easily annoyed or irritable 3 2 2 2   Afraid - awful might happen 3 1 0 2  Total GAD 7 Score 20 10 6 13   Anxiety Difficulty Very difficult Somewhat difficult Somewhat difficult Somewhat difficult     {History (Optional):23778}    ROS As per HPI.    Objective:     BP 113/77   Pulse 82   Wt 201 lb (91.2 kg)   SpO2 97%   BMI 35.61 kg/m  {Vitals History (Optional):23777}  Physical Exam Vitals and nursing note reviewed.  Constitutional:      General: She is not in acute distress.    Appearance: She is obese. She is not ill-appearing, toxic-appearing or diaphoretic.  Cardiovascular:     Rate and Rhythm: Normal rate and regular rhythm.     Heart sounds: Normal heart sounds. No murmur heard. Pulmonary:     Effort: Pulmonary effort is normal. No respiratory distress.     Breath sounds: Normal breath sounds.  Abdominal:     General: There is no distension.  Tenderness: There is no abdominal tenderness. There is no right CVA tenderness, left CVA tenderness, guarding or rebound.  Musculoskeletal:     Right lower leg: No edema.     Left lower leg: No edema.  Skin:    General: Skin is warm and dry.  Neurological:     General: No focal deficit present.     Mental Status: She is alert and oriented to person, place, and time.  Psychiatric:        Mood and Affect: Mood normal.        Behavior: Behavior normal.      No results found for any visits on 06/29/23.  {Labs (Optional):23779}  The ASCVD Risk score (Arnett DK, et al., 2019) failed to calculate for the following reasons:   The 2019 ASCVD risk score is only valid for ages 73 to 32    Assessment & Plan:   Rebekah [name] was seen today for medical management of chronic issues and nausea.  Diagnoses and all orders for this  visit:  Dysuria -     Urinalysis, Routine w reflex microscopic -     Urine Culture  Generalized anxiety disorder with panic attacks -     hydrOXYzine (ATARAX) 10 MG tablet; Take 1 tablet (10 mg total) by mouth 3 (three) times daily as needed. -     Ambulatory referral to Psychiatry  Depression, recurrent (HCC) -     Ambulatory referral to Psychiatry  Gastroesophageal reflux disease without esophagitis -     Ambulatory referral to Gastroenterology     No follow-ups on file.    Gabriel Earing, FNP

## 2023-07-02 ENCOUNTER — Encounter: Payer: Self-pay | Admitting: Family Medicine

## 2023-07-03 DIAGNOSIS — F4322 Adjustment disorder with anxiety: Secondary | ICD-10-CM | POA: Diagnosis not present

## 2023-07-04 ENCOUNTER — Other Ambulatory Visit: Payer: Medicaid Other

## 2023-07-04 DIAGNOSIS — R300 Dysuria: Secondary | ICD-10-CM

## 2023-07-04 LAB — URINALYSIS, COMPLETE
Bilirubin, UA: NEGATIVE
Glucose, UA: NEGATIVE
Leukocytes,UA: NEGATIVE
Nitrite, UA: NEGATIVE
Protein,UA: NEGATIVE
Specific Gravity, UA: 1.025 (ref 1.005–1.030)
Urobilinogen, Ur: 0.2 mg/dL (ref 0.2–1.0)
pH, UA: 6.5 (ref 5.0–7.5)

## 2023-07-04 LAB — MICROSCOPIC EXAMINATION: WBC, UA: NONE SEEN /hpf (ref 0–5)

## 2023-07-05 DIAGNOSIS — F4322 Adjustment disorder with anxiety: Secondary | ICD-10-CM | POA: Diagnosis not present

## 2023-07-05 DIAGNOSIS — Z419 Encounter for procedure for purposes other than remedying health state, unspecified: Secondary | ICD-10-CM | POA: Diagnosis not present

## 2023-07-10 DIAGNOSIS — F4322 Adjustment disorder with anxiety: Secondary | ICD-10-CM | POA: Diagnosis not present

## 2023-07-12 DIAGNOSIS — F4322 Adjustment disorder with anxiety: Secondary | ICD-10-CM | POA: Diagnosis not present

## 2023-07-16 ENCOUNTER — Ambulatory Visit (INDEPENDENT_AMBULATORY_CARE_PROVIDER_SITE_OTHER): Payer: Medicaid Other | Admitting: Gastroenterology

## 2023-07-17 DIAGNOSIS — F4322 Adjustment disorder with anxiety: Secondary | ICD-10-CM | POA: Diagnosis not present

## 2023-07-19 DIAGNOSIS — F4322 Adjustment disorder with anxiety: Secondary | ICD-10-CM | POA: Diagnosis not present

## 2023-07-24 DIAGNOSIS — F4322 Adjustment disorder with anxiety: Secondary | ICD-10-CM | POA: Diagnosis not present

## 2023-07-26 DIAGNOSIS — F4322 Adjustment disorder with anxiety: Secondary | ICD-10-CM | POA: Diagnosis not present

## 2023-07-31 DIAGNOSIS — F4322 Adjustment disorder with anxiety: Secondary | ICD-10-CM | POA: Diagnosis not present

## 2023-08-05 DIAGNOSIS — Z419 Encounter for procedure for purposes other than remedying health state, unspecified: Secondary | ICD-10-CM | POA: Diagnosis not present

## 2023-08-07 DIAGNOSIS — Z3042 Encounter for surveillance of injectable contraceptive: Secondary | ICD-10-CM | POA: Diagnosis not present

## 2023-08-16 DIAGNOSIS — F4322 Adjustment disorder with anxiety: Secondary | ICD-10-CM | POA: Diagnosis not present

## 2023-08-17 ENCOUNTER — Telehealth: Payer: Self-pay | Admitting: Family Medicine

## 2023-08-17 NOTE — Telephone Encounter (Signed)
I spoke to pt and advised pt to ask the pharmacist which one they recommend and pt voiced understanding.

## 2023-08-20 ENCOUNTER — Ambulatory Visit: Payer: Medicaid Other | Admitting: Family Medicine

## 2023-08-20 ENCOUNTER — Encounter: Payer: Self-pay | Admitting: Family Medicine

## 2023-08-20 DIAGNOSIS — R350 Frequency of micturition: Secondary | ICD-10-CM | POA: Diagnosis not present

## 2023-08-20 DIAGNOSIS — R300 Dysuria: Secondary | ICD-10-CM

## 2023-08-20 LAB — URINALYSIS, ROUTINE W REFLEX MICROSCOPIC
Bilirubin, UA: NEGATIVE
Glucose, UA: NEGATIVE
Ketones, UA: NEGATIVE
Leukocytes,UA: NEGATIVE
Nitrite, UA: NEGATIVE
Protein,UA: NEGATIVE
Specific Gravity, UA: 1.02 (ref 1.005–1.030)
Urobilinogen, Ur: 0.2 mg/dL (ref 0.2–1.0)
pH, UA: 6.5 (ref 5.0–7.5)

## 2023-08-20 LAB — MICROSCOPIC EXAMINATION
Mucus, UA: NONE SEEN
Yeast, UA: NONE SEEN

## 2023-08-20 LAB — WET PREP FOR TRICH, YEAST, CLUE
Clue Cell Exam: NEGATIVE
Trichomonas Exam: NEGATIVE
Yeast Exam: NEGATIVE

## 2023-08-20 NOTE — Progress Notes (Signed)
BP 114/79   Pulse 95   Temp 98.5 F (36.9 C)   Ht 5\' 3"  (1.6 m)   Wt 202 lb (91.6 kg)   SpO2 96%   BMI 35.78 kg/m    Subjective:   Patient ID: Rebekah [name], female    DOB: Jan 11, 1986, 37 y.o.   MRN: [id number]  HPI: Rebekah [name] is a 37 y.o. female presenting on 08/20/2023 for Dysuria   HPI Dysuria Patient is coming in for dysuria and frequency.  She says she had some issues couple months ago and it did not seem to fully resolve and did not necessarily find infection and then it started back up with some lower abdominal pain and still having the frequency and urgency that she was having over the past 4 days.  She did take some Azo over the past couple days and did feel like that improved things.  She says she is sexually active with 1 female partner and has been with them for 6 years.  She denies any vaginal discharge.  She says she just had some irritation.  Relevant past medical, surgical, family and social history reviewed and updated as indicated. Interim medical history since our last visit reviewed. Allergies and medications reviewed and updated.  Review of Systems  Constitutional:  Negative for chills and fever.  Eyes:  Negative for visual disturbance.  Respiratory:  Negative for chest tightness and shortness of breath.   Cardiovascular:  Negative for chest pain and leg swelling.  Gastrointestinal:  Positive for abdominal pain.  Genitourinary:  Positive for frequency and urgency. Negative for difficulty urinating, dysuria, flank pain, hematuria, vaginal bleeding, vaginal discharge and vaginal pain.  Musculoskeletal:  Negative for back pain and gait problem.  Skin:  Negative for rash.  Neurological:  Negative for light-headedness and headaches.  Psychiatric/Behavioral:  Negative for agitation and behavioral problems.   All other systems reviewed and are negative.   Per HPI unless specifically indicated above   Allergies as of 08/20/2023   No Known Allergies       Medication List        Accurate as of August 20, 2023  3:00 PM. If you have any questions, ask your nurse or doctor.          acetaminophen 500 MG tablet Commonly known as: TYLENOL Take 500 mg by mouth every 6 (six) hours as needed for mild pain or moderate pain.   buprenorphine 8 MG Subl SL tablet Commonly known as: SUBUTEX Place 8 mg under the tongue daily.   clobetasol cream 0.05 % Commonly known as: TEMOVATE Apply 1 Application topically 2 (two) times daily.   hydrOXYzine 10 MG tablet Commonly known as: ATARAX Take 1 tablet (10 mg total) by mouth 3 (three) times daily as needed.   medroxyPROGESTERone 150 MG/ML injection Commonly known as: DEPO-PROVERA Inject 150 mg into the muscle every 3 (three) months.   omeprazole 20 MG capsule Commonly known as: PRILOSEC Take 1 capsule (20 mg total) by mouth daily.   ondansetron 4 MG tablet Commonly known as: Zofran Take 1 tablet (4 mg total) by mouth every 8 (eight) hours as needed for nausea or vomiting.         Objective:   BP 114/79   Pulse 95   Temp 98.5 F (36.9 C)   Ht 5\' 3"  (1.6 m)   Wt 202 lb (91.6 kg)   SpO2 96%   BMI 35.78 kg/m   Wt Readings from Last 3 Encounters:  08/20/23 202 lb (91.6 kg)  06/29/23 201 lb (91.2 kg)  05/17/23 201 lb (91.2 kg)    Physical Exam Vitals and nursing note reviewed. Exam conducted with a chaperone present.  Constitutional:      General: She is not in acute distress.    Appearance: Normal appearance. She is well-developed. She is obese. She is not diaphoretic.  Eyes:     Conjunctiva/sclera: Conjunctivae normal.  Abdominal:     General: Abdomen is flat. Bowel sounds are normal. There is no distension.     Palpations: Abdomen is soft.     Tenderness: There is no abdominal tenderness. There is no right CVA tenderness, left CVA tenderness or guarding.  Genitourinary:    Exam position: Lithotomy position.     Labia:        Right: No rash or tenderness.         Left: No rash or tenderness.      Vagina: No vaginal discharge, erythema or tenderness.     Cervix: Normal. No cervical motion tenderness or discharge.     Uterus: Normal. Not deviated, not enlarged, not fixed and not tender.   Musculoskeletal:        General: No tenderness. Normal range of motion.  Skin:    General: Skin is warm and dry.     Findings: No rash.  Neurological:     Mental Status: She is alert and oriented to person, place, and time.     Coordination: Coordination normal.  Psychiatric:        Behavior: Behavior normal.       Assessment & Plan:   Problem List Items Addressed This Visit   None Visit Diagnoses     Dysuria       Relevant Orders   Urinalysis, Routine w reflex microscopic   Urine Culture   WET PREP FOR TRICH, YEAST, CLUE   Urine frequency       Relevant Orders   Urinalysis, Routine w reflex microscopic   Urine Culture   WET PREP FOR TRICH, YEAST, CLUE       Urinalysis shows 1+ blood and 0-5 WBCs and crystals present, amorphous crystals and few bacteria.  Urine did not show any major signs of infection and her symptoms have improved.  Will send out for wet prep.  If she continues to have problems and we do not find any answers she may want to go to a urologist. Follow up plan: Return if symptoms worsen or fail to improve.  Counseling provided for all of the vaccine components Orders Placed This Encounter  Procedures   Urine Culture   WET PREP FOR TRICH, YEAST, CLUE   Urinalysis, Routine w reflex microscopic    Arville Care, MD [facility] 08/20/2023, 3:00 PM

## 2023-08-21 DIAGNOSIS — F4322 Adjustment disorder with anxiety: Secondary | ICD-10-CM | POA: Diagnosis not present

## 2023-08-21 LAB — URINE CULTURE

## 2023-08-27 ENCOUNTER — Telehealth: Payer: Self-pay | Admitting: Family Medicine

## 2023-08-27 DIAGNOSIS — N390 Urinary tract infection, site not specified: Secondary | ICD-10-CM

## 2023-08-27 DIAGNOSIS — R300 Dysuria: Secondary | ICD-10-CM

## 2023-08-27 NOTE — Telephone Encounter (Signed)
Patient aware and verbalized understanding.

## 2023-08-27 NOTE — Telephone Encounter (Signed)
Yes, ok to take AZO. I have placed a referral for Urologists.

## 2023-08-27 NOTE — Telephone Encounter (Signed)
Reviewed results with pt per providers notes. Urine culture came back negative so pt scheduled an appt to follow up with PCP. Pt is scheduled for 09/06/23.  In the meantime, pt wants to know if it is ok for her to take AZO? Says she last took it for 2 days about 1.5 weeks ago.

## 2023-08-28 ENCOUNTER — Telehealth: Payer: Self-pay | Admitting: Family Medicine

## 2023-08-28 DIAGNOSIS — F4322 Adjustment disorder with anxiety: Secondary | ICD-10-CM | POA: Diagnosis not present

## 2023-08-29 ENCOUNTER — Other Ambulatory Visit: Payer: Self-pay | Admitting: Family Medicine

## 2023-08-29 NOTE — Telephone Encounter (Signed)
Referral redirected to [facility] as requested.

## 2023-08-29 NOTE — Telephone Encounter (Signed)
Pt aware referral sent to [facility].

## 2023-08-31 DIAGNOSIS — F4322 Adjustment disorder with anxiety: Secondary | ICD-10-CM | POA: Diagnosis not present

## 2023-09-04 DIAGNOSIS — Z419 Encounter for procedure for purposes other than remedying health state, unspecified: Secondary | ICD-10-CM | POA: Diagnosis not present

## 2023-09-06 ENCOUNTER — Ambulatory Visit: Payer: Medicaid Other | Admitting: Family Medicine

## 2023-09-11 ENCOUNTER — Telehealth: Payer: Self-pay | Admitting: Family Medicine

## 2023-09-11 DIAGNOSIS — F4322 Adjustment disorder with anxiety: Secondary | ICD-10-CM | POA: Diagnosis not present

## 2023-09-12 NOTE — Telephone Encounter (Signed)
I have redirected Referral to [facility] is not accepting New Patient's at this time.

## 2023-09-17 DIAGNOSIS — F4322 Adjustment disorder with anxiety: Secondary | ICD-10-CM | POA: Diagnosis not present

## 2023-09-25 DIAGNOSIS — F4322 Adjustment disorder with anxiety: Secondary | ICD-10-CM | POA: Diagnosis not present

## 2023-10-01 ENCOUNTER — Telehealth: Payer: Self-pay | Admitting: Family Medicine

## 2023-10-01 NOTE — Telephone Encounter (Signed)
Pt called requesting that PCP try sending in [facility] Rx again. Says her coworker has the same insurance as she does and insurance approved her [employer] Rx.   Pt uses [employer].  Please advise and call patient with update.

## 2023-10-03 DIAGNOSIS — F4322 Adjustment disorder with anxiety: Secondary | ICD-10-CM | POA: Diagnosis not present

## 2023-10-03 NOTE — Telephone Encounter (Signed)
Will need a visit for updated weight and documentation for prior auth for wegovy. Will also need to update labs first.

## 2023-10-03 NOTE — Telephone Encounter (Signed)
Left message for pt to return call.

## 2023-10-04 NOTE — Telephone Encounter (Signed)
Pt aware. She scheduled apt for 10/11/2023.

## 2023-10-05 DIAGNOSIS — Z419 Encounter for procedure for purposes other than remedying health state, unspecified: Secondary | ICD-10-CM | POA: Diagnosis not present

## 2023-10-08 ENCOUNTER — Telehealth: Payer: Self-pay | Admitting: Family Medicine

## 2023-10-08 DIAGNOSIS — N200 Calculus of kidney: Secondary | ICD-10-CM | POA: Diagnosis not present

## 2023-10-08 DIAGNOSIS — N3001 Acute cystitis with hematuria: Secondary | ICD-10-CM | POA: Diagnosis not present

## 2023-10-08 DIAGNOSIS — R319 Hematuria, unspecified: Secondary | ICD-10-CM | POA: Diagnosis not present

## 2023-10-08 DIAGNOSIS — R1031 Right lower quadrant pain: Secondary | ICD-10-CM | POA: Diagnosis not present

## 2023-10-08 DIAGNOSIS — N132 Hydronephrosis with renal and ureteral calculous obstruction: Secondary | ICD-10-CM | POA: Diagnosis not present

## 2023-10-08 DIAGNOSIS — F17210 Nicotine dependence, cigarettes, uncomplicated: Secondary | ICD-10-CM | POA: Diagnosis not present

## 2023-10-08 DIAGNOSIS — D72829 Elevated white blood cell count, unspecified: Secondary | ICD-10-CM | POA: Diagnosis not present

## 2023-10-08 DIAGNOSIS — N202 Calculus of kidney with calculus of ureter: Secondary | ICD-10-CM | POA: Diagnosis not present

## 2023-10-08 NOTE — Telephone Encounter (Signed)
Called pt to schedule appt, she is at [facility] ER and was told she has a UTI

## 2023-10-08 NOTE — Telephone Encounter (Signed)
NTBS.

## 2023-10-09 DIAGNOSIS — F4322 Adjustment disorder with anxiety: Secondary | ICD-10-CM | POA: Diagnosis not present

## 2023-10-11 ENCOUNTER — Encounter: Payer: Self-pay | Admitting: Family Medicine

## 2023-10-11 ENCOUNTER — Ambulatory Visit: Payer: Medicaid Other | Admitting: Family Medicine

## 2023-10-11 DIAGNOSIS — F411 Generalized anxiety disorder: Secondary | ICD-10-CM | POA: Diagnosis not present

## 2023-10-11 DIAGNOSIS — F339 Major depressive disorder, recurrent, unspecified: Secondary | ICD-10-CM

## 2023-10-11 DIAGNOSIS — N200 Calculus of kidney: Secondary | ICD-10-CM | POA: Diagnosis not present

## 2023-10-11 DIAGNOSIS — F410 Panic disorder [episodic paroxysmal anxiety] without agoraphobia: Secondary | ICD-10-CM | POA: Insufficient documentation

## 2023-10-11 DIAGNOSIS — E782 Mixed hyperlipidemia: Secondary | ICD-10-CM

## 2023-10-11 MED ORDER — SEMAGLUTIDE-WEIGHT MANAGEMENT 0.25 MG/0.5ML ~~LOC~~ SOAJ: 0.2500 mg | SUBCUTANEOUS | 0 refills | Status: AC

## 2023-10-11 MED ORDER — SEMAGLUTIDE-WEIGHT MANAGEMENT 2.4 MG/0.75ML ~~LOC~~ SOAJ: 2.4000 mg | SUBCUTANEOUS | 3 refills | Status: AC

## 2023-10-11 MED ORDER — ONDANSETRON HCL 4 MG PO TABS: 4.0000 mg | ORAL_TABLET | Freq: Three times a day (TID) | ORAL | 0 refills | Status: DC | PRN

## 2023-10-11 MED ORDER — SEMAGLUTIDE-WEIGHT MANAGEMENT 1 MG/0.5ML ~~LOC~~ SOAJ: 1.0000 mg | SUBCUTANEOUS | 0 refills | Status: AC

## 2023-10-11 MED ORDER — SEMAGLUTIDE-WEIGHT MANAGEMENT 1.7 MG/0.75ML ~~LOC~~ SOAJ: 1.7000 mg | SUBCUTANEOUS | 1 refills | Status: AC

## 2023-10-11 MED ORDER — SEMAGLUTIDE-WEIGHT MANAGEMENT 0.5 MG/0.5ML ~~LOC~~ SOAJ: 0.5000 mg | SUBCUTANEOUS | 0 refills | Status: AC

## 2023-10-11 NOTE — Progress Notes (Signed)
Acute Office Visit  Subjective:     Patient ID: Rebekah [name], female    DOB: 10-Jun-1986, 37 y.o.   MRN: [id number]  Chief Complaint  Patient presents with   Obesity    HPI Patient is in today for obesity. She has been trying to follow a well balanced diet. Not currently exercising. She is interesting in [facility] for weight loss. She has a history of high cholesterol.  She was seen in the ER on 10/08/23 for RLQ abdomina pain. CT show 4 mm kidney stone with moderate right sided hydronephrosis. She was discharged home with cefdinir for a UTI. She reports feeling much better. No longer having pain. Denies urinary symptoms, fever, chills, nasuea, vomiting. Has appt with urology on 10/23/23.   Referral to [facility] has been authorized for anxiety and depression. She has the contact information for the office and plans to call them to schedule.        10/11/2023    2:53 PM 08/20/2023    2:28 PM 06/29/2023    2:40 PM  Depression screen PHQ 2/9  Decreased Interest 2 2 2   Down, Depressed, Hopeless 2 2 2   PHQ - 2 Score 4 4 4   Altered sleeping 3 2 2   Tired, decreased energy 2 1 3   Change in appetite 2 1 3   Feeling bad or failure about yourself  0 1 1  Trouble concentrating 1 1 1   Moving slowly or fidgety/restless 0 0 0  Suicidal thoughts 0 0 0  PHQ-9 Score 12 10 14   Difficult doing work/chores Somewhat difficult Somewhat difficult Very difficult      10/11/2023    2:52 PM 08/20/2023    2:28 PM 06/29/2023    2:40 PM 05/17/2023    2:35 PM  GAD 7 : Generalized Anxiety Score  Nervous, Anxious, on Edge 2 2 3 2   Control/stop worrying 2 2 3 2   Worry too much - different things 1 3 3 2   Trouble relaxing 1 2 3 1   Restless 0 1 2 0  Easily annoyed or irritable 2 0 3 2  Afraid - awful might happen 1 1 3 1   Total GAD 7 Score 9 11 20 10   Anxiety Difficulty Somewhat difficult Very difficult Very difficult Somewhat difficult      ROS As per HPI.      Objective:    BP 106/68    Pulse 90   Temp 98.2 F (36.8 C) (Temporal)   Ht 5\' 3"  (1.6 m)   Wt 205 lb (93 kg)   SpO2 96%   BMI 36.31 kg/m    Physical Exam Vitals and nursing note reviewed.  Constitutional:      General: She is not in acute distress.    Appearance: She is obese. She is not ill-appearing, toxic-appearing or diaphoretic.  Neck:     Thyroid: No thyroid mass, thyromegaly or thyroid tenderness.  Cardiovascular:     Rate and Rhythm: Normal rate and regular rhythm.     Heart sounds: Normal heart sounds. No murmur heard. Pulmonary:     Effort: Pulmonary effort is normal. No respiratory distress.     Breath sounds: Normal breath sounds. No wheezing.  Abdominal:     General: Bowel sounds are normal. There is no distension.     Palpations: Abdomen is soft.     Tenderness: There is no abdominal tenderness. There is no right CVA tenderness, left CVA tenderness, guarding or rebound.  Musculoskeletal:  Cervical back: Neck supple. No rigidity.     Right lower leg: No edema.     Left lower leg: No edema.  Skin:    General: Skin is warm and dry.  Neurological:     General: No focal deficit present.     Mental Status: She is alert and oriented to person, place, and time.  Psychiatric:        Mood and Affect: Mood normal.        Behavior: Behavior normal.        Thought Content: Thought content normal.        Judgment: Judgment normal.     No results found for any visits on 10/11/23.      Assessment & Plan:   Chinmayi was seen today for obesity.  Diagnoses and all orders for this visit:  Morbid obesity (HCC) Patient's BMI is >30 mg/m2.  Patient's current BMI is Body mass index is 36.31 kg/m. With HLD and depression. Patient is currently enrolled in a healthy eating plan along with encouraged exercise.  Patient has contraindications to phentermine, Contrave & Qsymia (contains phentermine) due to hx of substance abuse.  Patient does not have a personal or family history of medullary thyroid  carcinoma (MTC) or Multiple Endocrine Neoplasia syndrome type 2 (MEN 2). Discussed side effects of wegovy, diet, and exercise. Zofran prn for nausea if needed.  -     ondansetron (ZOFRAN) 4 MG tablet; Take 1 tablet (4 mg total) by mouth every 8 (eight) hours as needed for nausea or vomiting. -     Semaglutide-Weight Management 0.25 MG/0.5ML SOAJ; Inject 0.25 mg into the skin once a week for 28 days. -     Semaglutide-Weight Management 0.5 MG/0.5ML SOAJ; Inject 0.5 mg into the skin once a week for 28 days. -     Semaglutide-Weight Management 1 MG/0.5ML SOAJ; Inject 1 mg into the skin once a week for 28 days. -     Semaglutide-Weight Management 1.7 MG/0.75ML SOAJ; Inject 1.7 mg into the skin once a week for 28 days. -     Semaglutide-Weight Management 2.4 MG/0.75ML SOAJ; Inject 2.4 mg into the skin once a week for 28 days.  Mixed hyperlipidemia Fasting labs pending.  -     CMP14+EGFR -     CBC with Differential/Platelet -     Lipid panel -     TSH  Kidney stone Improving symtpoms with treatment. Reviewed ER note, imaging. Keep appt with urology. Complete medications as prescribed. Follow up sooner for new or worsening symptoms.   Depression, recurrent (HCC) Generalized anxiety disorder with panic attacks Not well controlled. Denies SI. Schedule appt with BH.    Return in about 6 weeks (around 11/22/2023) for weight follow up.  The patient indicates understanding of these issues and agrees with the plan.   Gabriel Earing, FNP

## 2023-10-12 LAB — CMP14+EGFR
ALT: 36 [IU]/L — ABNORMAL HIGH (ref 0–32)
AST: 21 [IU]/L (ref 0–40)
Albumin: 4.8 g/dL (ref 3.9–4.9)
Alkaline Phosphatase: 64 [IU]/L (ref 44–121)
BUN/Creatinine Ratio: 14 (ref 9–23)
BUN: 11 mg/dL (ref 6–20)
Bilirubin Total: 0.5 mg/dL (ref 0.0–1.2)
CO2: 19 mmol/L — ABNORMAL LOW (ref 20–29)
Calcium: 9.8 mg/dL (ref 8.7–10.2)
Chloride: 104 mmol/L (ref 96–106)
Creatinine, Ser: 0.78 mg/dL (ref 0.57–1.00)
Globulin, Total: 1.9 g/dL (ref 1.5–4.5)
Glucose: 87 mg/dL (ref 70–99)
Potassium: 4.7 mmol/L (ref 3.5–5.2)
Sodium: 141 mmol/L (ref 134–144)
Total Protein: 6.7 g/dL (ref 6.0–8.5)
eGFR: 100 mL/min/{1.73_m2} (ref >59)

## 2023-10-12 LAB — LIPID PANEL
Chol/HDL Ratio: 5.4 ratio — ABNORMAL HIGH (ref 0.0–4.4)
Cholesterol, Total: 188 mg/dL (ref 100–199)
HDL: 35 mg/dL — ABNORMAL LOW (ref >39)
LDL Chol Calc (NIH): 133 mg/dL — ABNORMAL HIGH (ref 0–99)
Triglycerides: 109 mg/dL (ref 0–149)
VLDL Cholesterol Cal: 20 mg/dL (ref 5–40)

## 2023-10-12 LAB — CBC WITH DIFFERENTIAL/PLATELET
Basophils Absolute: 0.1 10*3/uL (ref 0.0–0.2)
Basos: 1 %
EOS (ABSOLUTE): 0.4 10*3/uL (ref 0.0–0.4)
Eos: 3 %
Hematocrit: 35.9 % (ref 34.0–46.6)
Hemoglobin: 11.7 g/dL (ref 11.1–15.9)
Immature Grans (Abs): 0.1 10*3/uL (ref 0.0–0.1)
Immature Granulocytes: 1 %
Lymphocytes Absolute: 3.7 10*3/uL — ABNORMAL HIGH (ref 0.7–3.1)
Lymphs: 32 %
MCH: 35.2 pg — ABNORMAL HIGH (ref 26.6–33.0)
MCHC: 32.6 g/dL (ref 31.5–35.7)
MCV: 108 fL — ABNORMAL HIGH (ref 79–97)
Monocytes Absolute: 0.9 10*3/uL (ref 0.1–0.9)
Monocytes: 8 %
Neutrophils Absolute: 6.5 10*3/uL (ref 1.4–7.0)
Neutrophils: 55 %
Platelets: 425 10*3/uL (ref 150–450)
RBC: 3.32 x10E6/uL — ABNORMAL LOW (ref 3.77–5.28)
RDW: 13.3 % (ref 11.7–15.4)
WBC: 11.6 10*3/uL — ABNORMAL HIGH (ref 3.4–10.8)

## 2023-10-12 LAB — TSH: TSH: 1.48 u[IU]/mL (ref 0.450–4.500)

## 2023-10-17 DIAGNOSIS — F4322 Adjustment disorder with anxiety: Secondary | ICD-10-CM | POA: Diagnosis not present

## 2023-10-18 ENCOUNTER — Telehealth: Payer: Self-pay | Admitting: Pharmacist

## 2023-10-18 NOTE — Telephone Encounter (Signed)
Pharmacy Patient Advocate Encounter   Received notification from Physician's Office that prior authorization for Wegovy 0.25MG /0.5ML auto-injectors is required/requested.   Insurance verification completed.   The patient is insured through [facility] [place] .   Per test claim: PA required; PA submitted to above mentioned insurance via CoverMyMeds Key/confirmation #/[id number] Status is pending

## 2023-10-18 NOTE — Telephone Encounter (Signed)
Pharmacy Patient Advocate Encounter  Received notification from [facility] Medicaid that Prior Authorization for [facility] 0.25MG /0.5ML auto-injectors  has been APPROVED from 10/18/23 to 04/15/24   PA #/Case ID/Reference #: [id number]

## 2023-10-22 NOTE — Progress Notes (Signed)
H&P  Chief Complaint: Lower urinary tract symptoms  History of Present Illness: Rebekah [name] is a 37 y.o. year old female sent in for evaluation management of urinary tract symptoms that lasted several months, recently resolved after she was treated for a right ureteral stone.  She states that she has urinary frequency, urgency, pelvic pressure and eventually right flank pain coming to ahead 2 to 3 weeks ago.  She went to the emergency room in [place] on the fourth of this month.  Had a CT performed which revealed no renal calculi but her right distal ureteral stone, 4 mm.  She was placed on Flomax and subsequently passed it.  She had 1 stone many years ago.  She is currently asymptomatic.  Past Medical History:  Diagnosis Date   Hyperlipidemia     No past surgical history on file.  Home Medications:  (Not in a hospital admission)   Allergies: No Known Allergies  Family History  Problem Relation Age of Onset   Diabetes Father     Social History:  reports that she quit smoking about 5 months ago. Her smoking use included cigarettes. She started smoking about 10 years ago. She has a 2.5 pack-year smoking history. She has never used smokeless tobacco. She reports current alcohol use. She reports that she does not use drugs.  ROS: A complete review of systems was performed.  All systems are negative except for pertinent findings as noted.  Physical Exam:  Vital signs in last 24 hours: @VSRANGES @ General:  Alert and oriented, No acute distress HEENT: Normocephalic, atraumatic Neck: No JVD or lymphadenopathy Cardiovascular: Regular rate  Lungs: Normal inspiratory/expiratory excursion Extremities: No edema Neurologic: Grossly intact  I have reviewed notes from referring/previous physicians--PCPs notes  I have reviewed urinalysis results  I have independently reviewed prior imaging--I reviewed CT images with the patient.  She has no remaining renal calculi  I have reviewed  prior urine cultures  Impression/Assessment:  LUTS secondary to passed right ureteral stone  Plan:  Stone prevention diet discussed  As she has no further calculus disease, she will return as needed  Chelsea Aus 10/22/2023, 6:38 PM  Bertram Millard. Garrit Marrow MD

## 2023-10-23 ENCOUNTER — Encounter: Payer: Self-pay | Admitting: Urology

## 2023-10-23 ENCOUNTER — Ambulatory Visit: Payer: Medicaid Other | Admitting: Urology

## 2023-10-23 VITALS — BP 147/88 | HR 72 | Ht 63.0 in | Wt 205.0 lb

## 2023-10-23 DIAGNOSIS — Z87442 Personal history of urinary calculi: Secondary | ICD-10-CM

## 2023-10-23 DIAGNOSIS — R300 Dysuria: Secondary | ICD-10-CM

## 2023-10-23 DIAGNOSIS — F4322 Adjustment disorder with anxiety: Secondary | ICD-10-CM | POA: Diagnosis not present

## 2023-10-23 DIAGNOSIS — Z87898 Personal history of other specified conditions: Secondary | ICD-10-CM | POA: Diagnosis not present

## 2023-10-23 DIAGNOSIS — Z09 Encounter for follow-up examination after completed treatment for conditions other than malignant neoplasm: Secondary | ICD-10-CM

## 2023-10-24 LAB — URINALYSIS, ROUTINE W REFLEX MICROSCOPIC
Bilirubin, UA: NEGATIVE
Glucose, UA: NEGATIVE
Ketones, UA: NEGATIVE
Leukocytes,UA: NEGATIVE
Nitrite, UA: NEGATIVE
Protein,UA: NEGATIVE
Specific Gravity, UA: 1.03 (ref 1.005–1.030)
Urobilinogen, Ur: 0.2 mg/dL (ref 0.2–1.0)
pH, UA: 6 (ref 5.0–7.5)

## 2023-10-24 LAB — MICROSCOPIC EXAMINATION: Epithelial Cells (non renal): >10 /[HPF] — AB (ref 0–10)

## 2023-10-26 ENCOUNTER — Telehealth: Payer: Self-pay | Admitting: Family Medicine

## 2023-10-26 NOTE — Telephone Encounter (Unsigned)
Copied from CRM [id number]. Topic: Clinical - Medication Refill >> Oct 26, 2023  9:25 AM Dennison Nancy wrote: Most Recent Primary Care Visit:  Provider: Gabriel Earing  Department: [facility] MED  Visit Type: OFFICE VISIT  Date: 10/11/2023  Medication: ondansetron (ZOFRAN) 4 MG tablet  Has the patient contacted their pharmacy? No  (Agent: If no, request that the patient contact the pharmacy for the refill. If patient does not wish to contact the pharmacy document the reason why and proceed with request.) (Agent: If yes, when and what did the pharmacy advise?)  Is this the correct pharmacy for this prescription?  If no, delete pharmacy and type the correct one.  This is the patient's preferred pharmacy:  [facility] - [place], [place] - [address] [place] [address] Phone: [phone] Fax: [phone]  [id number] - [place], [place] - [address] [facility] [address] [facility] [address] [place] [address] Phone: [phone] Fax: [phone]   Has the prescription been filled recently? No   Is the patient out of the medication? No   Has the patient been seen for an appointment in the last year OR does the patient have an upcoming appointment? Yes   Can we respond through MyChart? No   Agent: Please be advised that Rx refills may take up to 3 business days. We ask that you follow-up with your pharmacy.

## 2023-10-30 ENCOUNTER — Telehealth: Payer: Self-pay | Admitting: Family Medicine

## 2023-10-30 DIAGNOSIS — F4322 Adjustment disorder with anxiety: Secondary | ICD-10-CM | POA: Diagnosis not present

## 2023-10-30 NOTE — Telephone Encounter (Unsigned)
Copied from CRM [id number]. Topic: Clinical - Medication Refill >> Oct 30, 2023  3:35 PM Conni Elliot wrote: Most Recent Primary Care Visit:  Provider: Gabriel Earing  Department: [facility]  Visit Type: OFFICE VISIT  Date: 10/11/2023  Medication: Zofran and constipation rx   Has the patient contacted their pharmacy? Yes (Agent: If no, request that the patient contact the pharmacy for the refill. If patient does not wish to contact the pharmacy document the reason why and proceed with request.) (Agent: If yes, when and what did the pharmacy advise?) Pharmacy does not have order  Is this the correct pharmacy for this prescription? Yes If no, delete pharmacy and type the correct one.  This is the patient's preferred pharmacy:  [facility] - [place], [place] - [address] [place] [address] Phone: [phone] Fax: [phone]  [id number] - [place], [place] - [address] [facility] [address] [facility] [address] [place] [address] Phone: [phone] Fax: [phone]   Has the prescription been filled recently? Yes  Is the patient out of the medication? No  Has the patient been seen for an appointment in the last year OR does the patient have an upcoming appointment? Yes  Can we respond through MyChart? Yes, preferably call   Agent: Please be advised that Rx refills may take up to 3 business days. We ask that you follow-up with your pharmacy.

## 2023-10-31 MED ORDER — ONDANSETRON HCL 4 MG PO TABS: 4.0000 mg | ORAL_TABLET | Freq: Three times a day (TID) | ORAL | 0 refills | Status: DC | PRN

## 2023-10-31 NOTE — Addendum Note (Signed)
Addended by: Ignacia Bayley on: 10/31/2023 09:29 AM   Modules accepted: Orders

## 2023-10-31 NOTE — Telephone Encounter (Signed)
Patient aware and verbalized understanding.

## 2023-10-31 NOTE — Telephone Encounter (Signed)
Ok to refill zofran. She needs to try OTC stool softeners. Medicaid does not pay for these.

## 2023-10-31 NOTE — Addendum Note (Signed)
Addended by: Austin Miles F on: 10/31/2023 09:30 AM   Modules accepted: Orders

## 2023-10-31 NOTE — Telephone Encounter (Signed)
Don't see constipation med on chart please advise

## 2023-11-04 DIAGNOSIS — Z419 Encounter for procedure for purposes other than remedying health state, unspecified: Secondary | ICD-10-CM | POA: Diagnosis not present

## 2023-11-06 DIAGNOSIS — F4322 Adjustment disorder with anxiety: Secondary | ICD-10-CM | POA: Diagnosis not present

## 2023-11-06 DIAGNOSIS — Z3042 Encounter for surveillance of injectable contraceptive: Secondary | ICD-10-CM | POA: Diagnosis not present

## 2023-11-18 DIAGNOSIS — K047 Periapical abscess without sinus: Secondary | ICD-10-CM | POA: Diagnosis not present

## 2023-11-20 DIAGNOSIS — F4322 Adjustment disorder with anxiety: Secondary | ICD-10-CM | POA: Diagnosis not present

## 2023-11-22 ENCOUNTER — Ambulatory Visit: Payer: Medicaid Other | Admitting: Family Medicine

## 2023-11-23 ENCOUNTER — Encounter: Payer: Self-pay | Admitting: Family Medicine

## 2023-11-27 DIAGNOSIS — F4322 Adjustment disorder with anxiety: Secondary | ICD-10-CM | POA: Diagnosis not present

## 2023-12-02 DIAGNOSIS — B3731 Acute candidiasis of vulva and vagina: Secondary | ICD-10-CM | POA: Diagnosis not present

## 2023-12-04 DIAGNOSIS — F4322 Adjustment disorder with anxiety: Secondary | ICD-10-CM | POA: Diagnosis not present

## 2023-12-05 DIAGNOSIS — Z419 Encounter for procedure for purposes other than remedying health state, unspecified: Secondary | ICD-10-CM | POA: Diagnosis not present

## 2023-12-06 DIAGNOSIS — N907 Vulvar cyst: Secondary | ICD-10-CM | POA: Diagnosis not present

## 2023-12-10 ENCOUNTER — Other Ambulatory Visit: Payer: Self-pay | Admitting: Family Medicine

## 2023-12-10 MED ORDER — ONDANSETRON HCL 4 MG PO TABS: 4.0000 mg | ORAL_TABLET | Freq: Three times a day (TID) | ORAL | 0 refills | Status: DC | PRN

## 2023-12-10 NOTE — Telephone Encounter (Signed)
 Copied from CRM [id number]. Topic: Clinical - Medication Refill >> Dec 10, 2023 11:25 AM Tonda B wrote: Most Recent Primary Care Visit:  Provider: [name]  Department: [facility]  Visit Type: OFFICE VISIT  Date: 10/11/2023  Medication: ***  Has the patient contacted their pharmacy?  (Agent: If no, request that the patient contact the pharmacy for the refill. If patient does not wish to contact the pharmacy document the reason why and proceed with request.) (Agent: If yes, when and what did the pharmacy advise?)  Is this the correct pharmacy for this prescription?  If no, delete pharmacy and type the correct one.  This is the patient's preferred pharmacy:  [facility] - [place], [place] - [address] [place] [address] Phone: [phone] Fax: [phone]  [id number] - [place], [place] - [address] [facility] [facility] [address] [facility] [address] [place] [address] Phone: [phone] Fax: [phone]   Has the prescription been filled recently?   Is the patient out of the medication?   Has the patient been seen for an appointment in the last year OR does the patient have an upcoming appointment?   Can we respond through MyChart?   Agent: Please be advised that Rx refills may take up to 3 business days. We ask that you follow-up with your pharmacy.

## 2023-12-11 DIAGNOSIS — F4322 Adjustment disorder with anxiety: Secondary | ICD-10-CM | POA: Diagnosis not present

## 2023-12-18 DIAGNOSIS — F4322 Adjustment disorder with anxiety: Secondary | ICD-10-CM | POA: Diagnosis not present

## 2023-12-27 ENCOUNTER — Encounter: Payer: Self-pay | Admitting: Family Medicine

## 2023-12-27 ENCOUNTER — Ambulatory Visit: Payer: Medicaid Other | Admitting: Family Medicine

## 2023-12-27 DIAGNOSIS — R110 Nausea: Secondary | ICD-10-CM

## 2023-12-27 DIAGNOSIS — F1120 Opioid dependence, uncomplicated: Secondary | ICD-10-CM | POA: Diagnosis not present

## 2023-12-27 MED ORDER — ONDANSETRON HCL 4 MG PO TABS: 4.0000 mg | ORAL_TABLET | Freq: Three times a day (TID) | ORAL | 0 refills | Status: DC | PRN

## 2023-12-27 NOTE — Progress Notes (Signed)
   Established Patient Office Visit  Subjective   Patient ID: Rebekah [name], female    DOB: 28-Jun-1986  Age: 38 y.o. MRN: [id number]  Chief Complaint  Patient presents with   Obesity    HPI Rebekah [name] is here for follow up of obesity after starting [facility]. She will increase to the 0.5 mg dosage later this week. She has had some nausea but this has been improving significantly. No vomiting. She has been eating healthier. She has been increasing her water intake and decreasing soda intake. She has not exercised yet but has been researching some home exercise programs and plans to start.     ROS As per HPI.    Objective:     BP 116/80   Pulse (!) 104   Temp 98.7 F (37.1 C) (Temporal)   Ht 5\' 3"  (1.6 m)   Wt 200 lb (90.7 kg)   SpO2 96%   BMI 35.43 kg/m  Wt Readings from Last 3 Encounters:  12/27/23 200 lb (90.7 kg)  10/23/23 205 lb (93 kg)  10/11/23 205 lb (93 kg)      Physical Exam Vitals and nursing note reviewed.  Constitutional:      General: She is not in acute distress.    Appearance: She is not ill-appearing, toxic-appearing or diaphoretic.  Cardiovascular:     Rate and Rhythm: Normal rate and regular rhythm.     Heart sounds: Normal heart sounds. No murmur heard. Pulmonary:     Effort: Pulmonary effort is normal. No respiratory distress.     Breath sounds: Normal breath sounds. No wheezing.  Musculoskeletal:     Right lower leg: No edema.     Left lower leg: No edema.  Skin:    General: Skin is warm and dry.  Neurological:     General: No focal deficit present.     Mental Status: She is alert and oriented to person, place, and time.  Psychiatric:        Mood and Affect: Mood normal.        Behavior: Behavior normal.    No results found for any visits on 12/27/23.    The ASCVD Risk score (Arnett DK, et al., 2019) failed to calculate for the following reasons:   The 2019 ASCVD risk score is only valid for ages 13 to 16    Assessment & Plan:    Rebekah [name] was seen today for obesity.  Diagnoses and all orders for this visit:  Morbid obesity (HCC) Down 5 lbs since starting wegovy. Will titrate up to 0.5 mg dosage this week. Discussed diet and exercise along with medication for weight loss. Refill provided of zofran as needed. Labs pending as below.  -     TSH -     BMP8+EGFR  Nausea -     ondansetron (ZOFRAN) 4 MG tablet; Take 1 tablet (4 mg total) by mouth every 8 (eight) hours as needed for nausea or vomiting.  Opioid dependence on agonist therapy Managed by [facility].   Return in about 6 weeks (around 02/07/2024) for medication follow up.   The patient indicates understanding of these issues and agrees with the plan.  Gabriel Earing, FNP

## 2023-12-28 ENCOUNTER — Encounter: Payer: Self-pay | Admitting: Family Medicine

## 2023-12-28 LAB — BMP8+EGFR
BUN/Creatinine Ratio: 17 (ref 9–23)
BUN: 16 mg/dL (ref 6–20)
CO2: 22 mmol/L (ref 20–29)
Calcium: 9.8 mg/dL (ref 8.7–10.2)
Chloride: 106 mmol/L (ref 96–106)
Creatinine, Ser: 0.96 mg/dL (ref 0.57–1.00)
Glucose: 88 mg/dL (ref 70–99)
Potassium: 4.5 mmol/L (ref 3.5–5.2)
Sodium: 144 mmol/L (ref 134–144)
eGFR: 78 mL/min/{1.73_m2} (ref >59)

## 2023-12-28 LAB — TSH: TSH: 1.23 u[IU]/mL (ref 0.450–4.500)

## 2024-01-01 DIAGNOSIS — F4322 Adjustment disorder with anxiety: Secondary | ICD-10-CM | POA: Diagnosis not present

## 2024-01-05 DIAGNOSIS — Z419 Encounter for procedure for purposes other than remedying health state, unspecified: Secondary | ICD-10-CM | POA: Diagnosis not present

## 2024-01-07 DIAGNOSIS — Z885 Allergy status to narcotic agent status: Secondary | ICD-10-CM | POA: Diagnosis not present

## 2024-01-07 DIAGNOSIS — Z87442 Personal history of urinary calculi: Secondary | ICD-10-CM | POA: Diagnosis not present

## 2024-01-07 DIAGNOSIS — Z7985 Long-term (current) use of injectable non-insulin antidiabetic drugs: Secondary | ICD-10-CM | POA: Diagnosis not present

## 2024-01-07 DIAGNOSIS — Z20822 Contact with and (suspected) exposure to covid-19: Secondary | ICD-10-CM | POA: Diagnosis not present

## 2024-01-07 DIAGNOSIS — E860 Dehydration: Secondary | ICD-10-CM | POA: Diagnosis not present

## 2024-01-07 DIAGNOSIS — F32A Depression, unspecified: Secondary | ICD-10-CM | POA: Diagnosis not present

## 2024-01-07 DIAGNOSIS — R197 Diarrhea, unspecified: Secondary | ICD-10-CM | POA: Diagnosis not present

## 2024-01-07 DIAGNOSIS — R112 Nausea with vomiting, unspecified: Secondary | ICD-10-CM | POA: Diagnosis not present

## 2024-01-07 DIAGNOSIS — Z87891 Personal history of nicotine dependence: Secondary | ICD-10-CM | POA: Diagnosis not present

## 2024-01-08 ENCOUNTER — Telehealth: Payer: Self-pay

## 2024-01-08 DIAGNOSIS — F17290 Nicotine dependence, other tobacco product, uncomplicated: Secondary | ICD-10-CM | POA: Diagnosis not present

## 2024-01-08 DIAGNOSIS — R112 Nausea with vomiting, unspecified: Secondary | ICD-10-CM | POA: Diagnosis not present

## 2024-01-08 DIAGNOSIS — Z791 Long term (current) use of non-steroidal anti-inflammatories (NSAID): Secondary | ICD-10-CM | POA: Diagnosis not present

## 2024-01-08 DIAGNOSIS — K529 Noninfective gastroenteritis and colitis, unspecified: Secondary | ICD-10-CM | POA: Diagnosis not present

## 2024-01-08 DIAGNOSIS — Z885 Allergy status to narcotic agent status: Secondary | ICD-10-CM | POA: Diagnosis not present

## 2024-01-08 DIAGNOSIS — Z79899 Other long term (current) drug therapy: Secondary | ICD-10-CM | POA: Diagnosis not present

## 2024-01-08 NOTE — Transitions of Care (Post Inpatient/ED Visit) (Signed)
   01/08/2024  Name: Rebekah [name] MRN: [id number] DOB: 01-Sep-1986  Today's TOC FU Call Status: Today's TOC FU Call Status:: Unsuccessful Call (1st Attempt) Unsuccessful Call (1st Attempt) Date: 01/08/24  Attempted to reach the patient regarding the most recent Inpatient/ED visit.  Follow Up Plan: Additional outreach attempts will be made to reach the patient to complete the Transitions of Care (Post Inpatient/ED visit) call.   Signature  Avelina Essex, CMA (AAMA)  [facility]- AWV Program [phone]

## 2024-01-09 DIAGNOSIS — F4322 Adjustment disorder with anxiety: Secondary | ICD-10-CM | POA: Diagnosis not present

## 2024-01-09 NOTE — Transitions of Care (Post Inpatient/ED Visit) (Signed)
   01/09/2024  Name: Rebekah [name] MRN: [id number] DOB: 03-Dec-1986  Today's TOC FU Call Status: Today's TOC FU Call Status:: Successful TOC FU Call Completed Unsuccessful Call (1st Attempt) Date: 01/08/24 Patient's Name and Date of Birth confirmed.  Transition Care Management Follow-up Telephone Call Date of Discharge: 01/08/24 Discharge Facility: Other ([facility]) Name of Other ([facility]) Discharge Facility: [facility] Type of Discharge: Emergency Department Reason for ED Visit: Other: (vomiting) How have you been since you were released from the hospital?: Better Any questions or concerns?: No  Items Reviewed: Did you receive and understand the discharge instructions provided?: Yes Medications obtained,verified, and reconciled?: Yes (Medications Reviewed) Any new allergies since your discharge?: No Dietary orders reviewed?: NA Do you have support at home?: Yes  Medications Reviewed Today: Medications Reviewed Today     Reviewed by [name], [place] LABOR, CMA (Certified Medical Assistant) on 01/09/24 at [phone]  Med List Status: <None>   Medication Order Taking? Sig Documenting Provider Last Dose Status Informant  acetaminophen (TYLENOL) 500 MG tablet [id number] No Take 500 mg by mouth every 6 (six) hours as needed for mild pain or moderate pain. [provider] Taking Active   buprenorphine (SUBUTEX) 8 MG SUBL SL tablet [id number] No Place 8 mg under the tongue daily. [provider] Taking Active   medroxyPROGESTERone  (DEPO-PROVERA ) 150 MG/ML injection [id number] No Inject 150 mg into the muscle every 3 (three) months.  [provider] Taking Active Self           Med Note BLASE, LONELL POUR   Tue Mar 02, 2020  9:15 PM) Due for next injection 04/2020  ondansetron  (ZOFRAN ) 4 MG tablet [id number]  Take 1 tablet (4 mg total) by mouth every 8 (eight) hours as needed for nausea or vomiting. [name], FNP  Active   Semaglutide -Weight Management 1.7  MG/0.75ML SOAJ [id number] No Inject 1.7 mg into the skin once a week for 28 days.  Patient not taking: Reported on 12/27/2023   [name], FNP Not Taking Active   Semaglutide -Weight Management 2.4 MG/0.75ML SOAJ [id number] No Inject 2.4 mg into the skin once a week for 28 days.  Patient not taking: Reported on 12/27/2023   [name], FNP Not Taking Active             Home Care and Equipment/Supplies: Were Home Health Services Ordered?: NA Any new equipment or medical supplies ordered?: NA  Functional Questionnaire: Do you need assistance with bathing/showering or dressing?: No Do you need assistance with meal preparation?: No Do you need assistance with eating?: No Do you have difficulty maintaining continence: No Do you need assistance with getting out of bed/getting out of a chair/moving?: No Do you have difficulty managing or taking your medications?: No  Follow up appointments reviewed: PCP Follow-up appointment confirmed?: Yes Date of PCP follow-up appointment?: 01/10/24 Follow-up Provider: Bari Learn, DOD on 01/10/2024 Specialist Hospital Follow-up appointment confirmed?: NA Do you need transportation to your follow-up appointment?: No Do you understand care options if your condition(s) worsen?: Yes-patient verbalized understanding   [name], CMA  [facility] AWV Team Direct Dial: [phone]

## 2024-01-10 ENCOUNTER — Encounter: Payer: Self-pay | Admitting: Family

## 2024-01-10 ENCOUNTER — Ambulatory Visit: Payer: Medicaid Other | Admitting: Family

## 2024-01-10 VITALS — BP 117/71 | HR 89 | Temp 97.7°F | Wt 192.8 lb

## 2024-01-10 DIAGNOSIS — A084 Viral intestinal infection, unspecified: Secondary | ICD-10-CM

## 2024-01-10 DIAGNOSIS — Z09 Encounter for follow-up examination after completed treatment for conditions other than malignant neoplasm: Secondary | ICD-10-CM

## 2024-01-10 MED ORDER — ONDANSETRON 4 MG PO TBDP: 4.0000 mg | ORAL_TABLET | Freq: Three times a day (TID) | ORAL | 1 refills | Status: DC | PRN

## 2024-01-10 NOTE — Progress Notes (Signed)
 Subjective:    Patient ID: Rebekah [name], female    DOB: 10/16/1986, 38 y.o.   MRN: [id number]  Chief Complaint  Patient presents with   Hospitalization Follow-up    Pt thinks her symptoms came from using wegovy .   Pt presents to the office today for hospital follow up. She went to ED on 01/07/24 and 01/08/24 for Nausea and vomiting. These symptoms have resolved and feeling much better. Was negative for flu, COVID, and RSV.   She had increased her Wegovy  to 0.5 mg on 01/05/24.  Emesis  This is a new problem. The problem has been resolved. She has tried bed rest for the symptoms. The treatment provided mild relief.      Review of Systems  Gastrointestinal:  Positive for vomiting.    Social History   Socioeconomic History   Marital status: Legally Separated    Spouse name: Not on file   Number of children: Not on file   Years of education: Not on file   Highest education level: Not on file  Occupational History   Not on file  Tobacco Use   Smoking status: Former    Current packs/day: 0.00    Average packs/day: 0.3 packs/day for 10.0 years (2.5 ttl pk-yrs)    Types: Cigarettes    Start date: 05/02/2013    Quit date: 05/03/2023    Years since quitting: 0.6   Smokeless tobacco: Never  Substance and Sexual Activity   Alcohol use: Yes   Drug use: No   Sexual activity: Yes    Birth control/protection: Pill  Other Topics Concern   Not on file  Social History Narrative   Not on file   Social Drivers of Health   Financial Resource Strain: Not on file  Food Insecurity: No Food Insecurity (12/15/2019)   Received from [facility], [facility]   Hunger Vital Sign    Worried About Running Out of Food in the Last Year: Never true    Ran Out of Food in the Last Year: Never true  Transportation Needs: No Transportation Needs (12/15/2019)   Received from [facility], [facility]   PRAPARE - Transportation    Lack of Transportation (Medical): No    Lack of  Transportation (Non-Medical): No  Physical Activity: Not on file  Stress: No Stress Concern Present (12/15/2019)   Received from [facility], [facility] of Occupational Health - Occupational Stress Questionnaire    Feeling of Stress : Not at all  Social Connections: Not on file   Family History  Problem Relation Age of Onset   Diabetes Father         Objective:   Physical Exam    BP 117/71   Pulse 89   Temp 97.7 F (36.5 C)   Wt 192 lb 12.8 oz (87.5 kg)   SpO2 97%   BMI 34.15 kg/m      Assessment & Plan:  Rebekah [name] comes in today with chief complaint of Hospitalization Follow-up (Pt thinks her symptoms came from using wegovy .)   Diagnosis and orders addressed:  1. Hospital discharge follow-up (Primary)  2. Viral gastroenteritis - ondansetron  (ZOFRAN -ODT) 4 MG disintegrating tablet; Take 1 tablet (4 mg total) by mouth every 8 (eight) hours as needed for nausea or vomiting.  Dispense: 20 tablet; Refill: 1   Given symptoms resolved, I do not think it is from Wegovy . Pt will continue current dose Saturday. Zofran   given prn if she does have nausea.  Hospital notes reviewed   No follow-ups on file.    Bari Learn, FNP

## 2024-01-10 NOTE — Patient Instructions (Signed)
 Vomiting, Adult Vomiting is when stomach contents forcefully come out of the mouth. Many people notice nausea before vomiting. Vomiting can make you feel weak and cause you to become dehydrated. Dehydration can make you feel tired and thirsty, cause you to have a dry mouth, and decrease how often you urinate. Older adults and people who have other diseases or a weak body defense system (immune system) are at higher risk for dehydration. It is important to treat vomiting as told by your health care provider. Follow these instructions at home:  Watch your symptoms for any changes. Tell your health care provider about them. Eating and drinking     Follow these recommendations as told by your health care provider: Take an oral rehydration solution (ORS). This is a drink that is sold at pharmacies and retail stores. Eat bland, easy-to-digest foods in small amounts as you are able. These foods include bananas, applesauce, rice, lean meats, toast, and crackers. Drink clear fluids slowly and in small amounts as you are able. Clear fluids include water, ice chips, low-calorie sports drinks, and fruit juice that has water added (diluted fruit juice). Avoid drinking fluids that contain a lot of sugar or caffeine, such as energy drinks, sports drinks, and soda. Avoid alcohol. Avoid spicy or fatty foods.  General instructions Wash your hands often using soap and water for at least 20 seconds. If soap and water are not available, use hand sanitizer. Make sure that everyone in your household washes their hands frequently. Take over-the-counter and prescription medicines only as told by your health care provider. Rest at home while you recover. Watch your condition for any changes. Keep all follow-up visits. This is important. Contact a health care provider if: Your vomiting gets worse. You have new symptoms. You have a fever. You cannot drink fluids without vomiting. You feel light-headed or  dizzy. You have a headache. You have muscle cramps. You have a rash. You have pain while urinating. Get help right away if: You have pain in your chest, neck, arm, or jaw. Your heart is beating very quickly. You have trouble breathing or you are breathing very quickly. You feel extremely weak or you faint. Your skin feels cold and clammy. You feel confused. You have persistent vomiting. You have vomit that is bright red or looks like black coffee grounds. You have stools (feces) that are bloody or black, or stools that look like tar. You have a severe headache, a stiff neck, or both. You have severe pain, cramping, or bloating in your abdomen. You have signs of dehydration, such as: Dark urine, very little urine, or no urine. Cracked lips. Dry mouth. Sunken eyes. Sleepiness. Weakness. These symptoms may be an emergency. Get help right away. Call 911. Do not wait to see if the symptoms will go away. Do not drive yourself to the hospital. Summary Vomiting is when stomach contents forcefully come out of the mouth. Vomiting can cause you to become dehydrated. It is important to treat vomiting as told by your health care provider. Follow your health care provider's instructions about eating and drinking. Wash your hands often using soap and water for at least 20 seconds. If soap and water are not available, use hand sanitizer. Watch your condition for any changes and for signs of dehydration. Keep all follow-up visits. This is important. This information is not intended to replace advice given to you by your health care provider. Make sure you discuss any questions you have with your health care provider.  Document Revised: 05/27/2021 Document Reviewed: 05/27/2021 Elsevier Patient Education  2024 [employer].

## 2024-01-13 DIAGNOSIS — K529 Noninfective gastroenteritis and colitis, unspecified: Secondary | ICD-10-CM | POA: Diagnosis not present

## 2024-01-15 DIAGNOSIS — F4322 Adjustment disorder with anxiety: Secondary | ICD-10-CM | POA: Diagnosis not present

## 2024-01-25 DIAGNOSIS — Z3042 Encounter for surveillance of injectable contraceptive: Secondary | ICD-10-CM | POA: Diagnosis not present

## 2024-01-30 DIAGNOSIS — Z0000 Encounter for general adult medical examination without abnormal findings: Secondary | ICD-10-CM | POA: Diagnosis not present

## 2024-01-30 DIAGNOSIS — Z1151 Encounter for screening for human papillomavirus (HPV): Secondary | ICD-10-CM | POA: Diagnosis not present

## 2024-01-30 DIAGNOSIS — Z6832 Body mass index (BMI) 32.0-32.9, adult: Secondary | ICD-10-CM | POA: Diagnosis not present

## 2024-02-01 ENCOUNTER — Other Ambulatory Visit: Payer: Self-pay | Admitting: Family Medicine

## 2024-02-01 DIAGNOSIS — A084 Viral intestinal infection, unspecified: Secondary | ICD-10-CM

## 2024-02-01 MED ORDER — ONDANSETRON 4 MG PO TBDP: 4.0000 mg | ORAL_TABLET | Freq: Three times a day (TID) | ORAL | 1 refills | Status: DC | PRN

## 2024-02-01 NOTE — Telephone Encounter (Signed)
 Copied from CRM [id number]. Topic: Clinical - Medication Refill >> Feb 01, 2024  2:22 PM Carlatta H wrote: Most Recent Primary Care Visit:  Provider: Jannifer Rodney A  Department: [facility]  Visit Type: ACUTE  Date: 01/10/2024  Medication: ondansetron (ZOFRAN-ODT) 4 MG disintegrating tablet [[id number]]  Has the patient contacted their pharmacy? No (Agent: If no, request that the patient contact the pharmacy for the refill. If patient does not wish to contact the pharmacy document the reason why and proceed with request.) (Agent: If yes, when and what did the pharmacy advise?)  Is this the correct pharmacy for this prescription? Yes If no, delete pharmacy and type the correct one.  This is the patient's preferred pharmacy:  [facility] - [place], [place] - [address] [place] [address] Phone: [phone] Fax: [phone]     Has the prescription been filled recently? No  Is the patient out of the medication? Yes  Has the patient been seen for an appointment in the last year OR does the patient have an upcoming appointment? No  Can we respond through MyChart? Yes  Agent: Please be advised that Rx refills may take up to 3 business days. We ask that you follow-up with your pharmacy.

## 2024-02-01 NOTE — Telephone Encounter (Signed)
 Last Fill: 01/10/24 20 tabs/1 RF  Last OV: 01/10/24 ACUTE Next OV: 02/07/24  Routing to provider for review/authorization.

## 2024-02-02 DIAGNOSIS — Z419 Encounter for procedure for purposes other than remedying health state, unspecified: Secondary | ICD-10-CM | POA: Diagnosis not present

## 2024-02-06 DIAGNOSIS — F4322 Adjustment disorder with anxiety: Secondary | ICD-10-CM | POA: Diagnosis not present

## 2024-02-07 ENCOUNTER — Ambulatory Visit: Payer: Medicaid Other | Admitting: Family Medicine

## 2024-02-12 DIAGNOSIS — F4322 Adjustment disorder with anxiety: Secondary | ICD-10-CM | POA: Diagnosis not present

## 2024-02-22 ENCOUNTER — Ambulatory Visit: Admitting: Family Medicine

## 2024-02-28 DIAGNOSIS — R109 Unspecified abdominal pain: Secondary | ICD-10-CM | POA: Diagnosis not present

## 2024-02-29 DIAGNOSIS — F4322 Adjustment disorder with anxiety: Secondary | ICD-10-CM | POA: Diagnosis not present

## 2024-03-11 DIAGNOSIS — F4322 Adjustment disorder with anxiety: Secondary | ICD-10-CM | POA: Diagnosis not present

## 2024-03-12 ENCOUNTER — Other Ambulatory Visit: Payer: Self-pay | Admitting: Family Medicine

## 2024-03-12 NOTE — Telephone Encounter (Signed)
 She NTBS for follow up.

## 2024-03-15 DIAGNOSIS — Z419 Encounter for procedure for purposes other than remedying health state, unspecified: Secondary | ICD-10-CM | POA: Diagnosis not present

## 2024-04-01 DIAGNOSIS — F4322 Adjustment disorder with anxiety: Secondary | ICD-10-CM | POA: Diagnosis not present

## 2024-04-03 ENCOUNTER — Encounter: Payer: Self-pay | Admitting: Family Medicine

## 2024-04-08 DIAGNOSIS — F4322 Adjustment disorder with anxiety: Secondary | ICD-10-CM | POA: Diagnosis not present

## 2024-04-12 DIAGNOSIS — R110 Nausea: Secondary | ICD-10-CM | POA: Diagnosis not present

## 2024-04-14 DIAGNOSIS — Z419 Encounter for procedure for purposes other than remedying health state, unspecified: Secondary | ICD-10-CM | POA: Diagnosis not present

## 2024-04-15 DIAGNOSIS — Z3042 Encounter for surveillance of injectable contraceptive: Secondary | ICD-10-CM | POA: Diagnosis not present

## 2024-04-22 DIAGNOSIS — F4322 Adjustment disorder with anxiety: Secondary | ICD-10-CM | POA: Diagnosis not present

## 2024-04-30 DIAGNOSIS — F4322 Adjustment disorder with anxiety: Secondary | ICD-10-CM | POA: Diagnosis not present

## 2024-04-30 DIAGNOSIS — F4320 Adjustment disorder, unspecified: Secondary | ICD-10-CM | POA: Diagnosis not present

## 2024-05-01 DIAGNOSIS — L309 Dermatitis, unspecified: Secondary | ICD-10-CM | POA: Diagnosis not present

## 2024-05-05 ENCOUNTER — Other Ambulatory Visit (HOSPITAL_COMMUNITY): Payer: Self-pay

## 2024-05-05 ENCOUNTER — Telehealth: Payer: Self-pay

## 2024-05-05 ENCOUNTER — Telehealth: Payer: Self-pay | Admitting: Family Medicine

## 2024-05-05 NOTE — Telephone Encounter (Signed)
NTBS.

## 2024-05-05 NOTE — Telephone Encounter (Signed)
 Copied from CRM [id number]. Topic: Clinical - Medication Refill >> May 05, 2024 11:20 AM Tiffany H wrote: Medication: Semaglutide -Weight Management (WEGOVY ) 1.7 MG/0.75ML SOAJ [[id number]]  Please submit new Prior Authorization, requested by pharmacy.    ondansetron  (ZOFRAN -ODT) 4 MG disintegrating tablet [[id number]]   Has the patient contacted their pharmacy? Yes (Agent: If no, request that the patient contact the pharmacy for the refill. If patient does not wish to contact the pharmacy document the reason why and proceed with request.) (Agent: If yes, when and what did the pharmacy advise?)  This is the patient's preferred pharmacy:   [id number] - [place], [place] - [address] [place] [address] Phone: [phone] Fax: [phone]  Is this the correct pharmacy for this prescription? Yes If no, delete pharmacy and type the correct one.   Has the prescription been filled recently? Yes  Is the patient out of the medication? Yes  Has the patient been seen for an appointment in the last year OR does the patient have an upcoming appointment? Yes  Can we respond through MyChart? Yes  Agent: Please be advised that Rx refills may take up to 3 business days. We ask that you follow-up with your pharmacy.

## 2024-05-05 NOTE — Telephone Encounter (Signed)
 Lmtcb to schedule appt in office.

## 2024-05-05 NOTE — Telephone Encounter (Signed)
 Prior Authorization form/request asks a question that requires your assistance. Please see the question below and advise accordingly. The PA will not be submitted until the necessary information is received.

## 2024-05-06 ENCOUNTER — Other Ambulatory Visit (HOSPITAL_COMMUNITY): Payer: Self-pay

## 2024-05-07 NOTE — Telephone Encounter (Signed)
 Wrong medication was marked for PA. See other encounter for correct PA

## 2024-05-12 ENCOUNTER — Ambulatory Visit (INDEPENDENT_AMBULATORY_CARE_PROVIDER_SITE_OTHER): Admitting: Family Medicine

## 2024-05-12 ENCOUNTER — Encounter: Payer: Self-pay | Admitting: Family Medicine

## 2024-05-12 DIAGNOSIS — F339 Major depressive disorder, recurrent, unspecified: Secondary | ICD-10-CM

## 2024-05-12 DIAGNOSIS — R21 Rash and other nonspecific skin eruption: Secondary | ICD-10-CM

## 2024-05-12 DIAGNOSIS — F410 Panic disorder [episodic paroxysmal anxiety] without agoraphobia: Secondary | ICD-10-CM | POA: Diagnosis not present

## 2024-05-12 DIAGNOSIS — F411 Generalized anxiety disorder: Secondary | ICD-10-CM | POA: Diagnosis not present

## 2024-05-12 DIAGNOSIS — F17211 Nicotine dependence, cigarettes, in remission: Secondary | ICD-10-CM

## 2024-05-12 MED ORDER — ONDANSETRON 4 MG PO TBDP: 4.0000 mg | ORAL_TABLET | Freq: Three times a day (TID) | ORAL | 1 refills | Status: DC | PRN

## 2024-05-12 MED ORDER — TRIAMCINOLONE ACETONIDE 0.1 % EX CREA: 1.0000 | TOPICAL_CREAM | Freq: Two times a day (BID) | CUTANEOUS | 1 refills | Status: AC | PRN

## 2024-05-12 MED ORDER — WEGOVY 2.4 MG/0.75ML ~~LOC~~ SOAJ: 2.4000 mg | SUBCUTANEOUS | 1 refills | Status: DC

## 2024-05-12 NOTE — Progress Notes (Signed)
 Established Patient Office Visit  Subjective   Patient ID: Rebekah [name], female    DOB: 1986/11/26  Age: 38 y.o. MRN: [id number]  Chief Complaint  Patient presents with   Medication Refill    Patient got an ointment for her leg from urgent care and was wondering if you could refill when it runs out.     HPI Rebekah [name] is here for follow up of obesity after starting Wegovy . She is currently on maintenance dose of 2.4 mg weekly. She has had a net weight loss of 20 lbs so far. She had gotten down to 181 lb at her scale at home until a recent move has disrupted her diet. She has now completed the move and plans to return to cooking back at home and return to her well balanced diet that she was eating prior to the move. She has been using a smart hula hoop daily for at least 30 minutes. She continues to have some nausea in the morning intermittently. She is staying well hydrated.   Starting weight: 205 lb Starting BMI: 36.31  She has had a recent rash on her lower legs and hands. She was seen at [facility] for this. Cannot recall the diagnosis but was prescribed kenalog cream with good relief. Rash is currently cleared up. She is requesting a refill to have on hand if rash returns.    It has been 1 year she since quit smoking.   Continues to follow up with [facility] for anxiety and depression. Stable symptoms. Denies SI.      05/12/2024    9:12 AM 01/10/2024    3:08 PM 10/11/2023    2:53 PM  Depression screen PHQ 2/9  Decreased Interest 1 2 2   Down, Depressed, Hopeless 1 1 2   PHQ - 2 Score 2 3 4   Altered sleeping 3 3 3   Tired, decreased energy 2 2 2   Change in appetite 1 3 2   Feeling bad or failure about yourself  0 0 0  Trouble concentrating 1 1 1   Moving slowly or fidgety/restless 0 0 0  Suicidal thoughts 0 0 0  PHQ-9 Score 9 12 12   Difficult doing work/chores Somewhat difficult Somewhat difficult Somewhat difficult      05/12/2024    9:12 AM 01/10/2024    3:10 PM 10/11/2023    2:52 PM 08/20/2023     2:28 PM  GAD 7 : Generalized Anxiety Score  Nervous, Anxious, on Edge 3 3 2 2   Control/stop worrying 3 3 2 2   Worry too much - different things 3 3 1 3   Trouble relaxing 1 2 1 2   Restless 0 1 0 1  Easily annoyed or irritable 2 1 2  0  Afraid - awful might happen 0 2 1 1   Total GAD 7 Score 12 15 9 11   Anxiety Difficulty Somewhat difficult Somewhat difficult Somewhat difficult Very difficult     ROS As per HPI.    Objective:     BP 121/81   Pulse 94   Temp 98.4 F (36.9 C)   Ht 5\' 3"  (1.6 m)   Wt 185 lb 3.2 oz (84 kg)   SpO2 98%   BMI 32.81 kg/m  Wt Readings from Last 3 Encounters:  05/12/24 185 lb 3.2 oz (84 kg)  01/10/24 192 lb 12.8 oz (87.5 kg)  12/27/23 200 lb (90.7 kg)      Physical Exam Vitals and nursing note reviewed.  Constitutional:      General: She  is not in acute distress.    Appearance: She is not ill-appearing, toxic-appearing or diaphoretic.  Cardiovascular:     Rate and Rhythm: Normal rate and regular rhythm.     Heart sounds: Normal heart sounds. No murmur heard. Pulmonary:     Effort: Pulmonary effort is normal. No respiratory distress.     Breath sounds: Normal breath sounds. No wheezing.  Musculoskeletal:     Right lower leg: No edema.     Left lower leg: No edema.  Skin:    General: Skin is warm and dry.  Neurological:     General: No focal deficit present.     Mental Status: She is alert and oriented to person, place, and time.  Psychiatric:        Mood and Affect: Mood normal.        Behavior: Behavior normal.    No results found for any visits on 05/12/24.    The ASCVD Risk score (Arnett DK, et al., 2019) failed to calculate for the following reasons:   The 2019 ASCVD risk score is only valid for ages 60 to 29    Assessment & Plan:   Rebekah [name] was seen today for medication refill.  Diagnoses and all orders for this visit:  Morbid obesity (HCC) Patient's BMI is >30 mg/m2.  Patient's current BMI is Body mass index is 32.81  kg/m.Aaron Aas  Patient has lost and maintained a 5% body weight loss.  Patient is following a healthy eating plan along with exercise daily. Patient does not have a personal or family history of medullary thyroid  carcinoma (MTC) or Multiple Endocrine Neoplasia syndrome type 2 (MEN 2). -     Semaglutide -Weight Management (WEGOVY ) 2.4 MG/0.75ML SOAJ; Inject 2.4 mg into the skin once a week. -     ondansetron  (ZOFRAN -ODT) 4 MG disintegrating tablet; Take 1 tablet (4 mg total) by mouth every 8 (eight) hours as needed for nausea or vomiting.  Rash Refill provided.  -     triamcinolone cream (KENALOG) 0.1 %; Apply 1 Application topically 2 (two) times daily as needed.  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   Depression  Anxiety Stables. Denies SI. Managed by [facility].     Return in about 6 weeks (around 06/23/2024) for medication follow up.   The patient indicates understanding of these issues and agrees with the plan.  Albertha Huger, FNP

## 2024-05-14 ENCOUNTER — Telehealth: Payer: Self-pay

## 2024-05-14 NOTE — Telephone Encounter (Signed)
 Copied from CRM [id number]. Topic: Clinical - Medication Prior Auth >> May 14, 2024 12:29 PM Hamp Levine R wrote: Reason for CRM: Patient calling for an update on her pre-authorization for Semaglutide -Weight Management (WEGOVY ) 2.4 MG/0.75ML SOAJ. States she talked to NP [place] on Monday while in the office but hasn't heard anything.  Patient can be reached at [phone]

## 2024-05-14 NOTE — Telephone Encounter (Signed)
 Pt was seen in office on 05/12/24 please do PA for Wegovy .

## 2024-05-15 ENCOUNTER — Other Ambulatory Visit (HOSPITAL_COMMUNITY): Payer: Self-pay

## 2024-05-15 DIAGNOSIS — Z419 Encounter for procedure for purposes other than remedying health state, unspecified: Secondary | ICD-10-CM | POA: Diagnosis not present

## 2024-05-15 NOTE — Telephone Encounter (Signed)
Scan in media

## 2024-05-15 NOTE — Telephone Encounter (Signed)
 Pharmacy Patient Advocate Encounter   Received notification from Pt Calls Messages that prior authorization for Wegovy  2.4MG /0.75ML auto-injectors  is required/requested.   Insurance verification completed.   The patient is insured through [facility] [place] .   Per test claim: PA required; PA submitted to above mentioned insurance via CoverMyMeds Key/confirmation #/EOC [id number] Status is pending

## 2024-05-19 DIAGNOSIS — F419 Anxiety disorder, unspecified: Secondary | ICD-10-CM | POA: Diagnosis not present

## 2024-05-19 DIAGNOSIS — F4320 Adjustment disorder, unspecified: Secondary | ICD-10-CM | POA: Diagnosis not present

## 2024-05-27 DIAGNOSIS — F419 Anxiety disorder, unspecified: Secondary | ICD-10-CM | POA: Diagnosis not present

## 2024-06-04 ENCOUNTER — Other Ambulatory Visit: Payer: Self-pay | Admitting: Family Medicine

## 2024-06-14 DIAGNOSIS — Z419 Encounter for procedure for purposes other than remedying health state, unspecified: Secondary | ICD-10-CM | POA: Diagnosis not present

## 2024-06-16 DIAGNOSIS — F419 Anxiety disorder, unspecified: Secondary | ICD-10-CM | POA: Diagnosis not present

## 2024-06-25 ENCOUNTER — Ambulatory Visit (INDEPENDENT_AMBULATORY_CARE_PROVIDER_SITE_OTHER): Admitting: Family Medicine

## 2024-06-25 DIAGNOSIS — F339 Major depressive disorder, recurrent, unspecified: Secondary | ICD-10-CM | POA: Diagnosis not present

## 2024-06-25 DIAGNOSIS — F410 Panic disorder [episodic paroxysmal anxiety] without agoraphobia: Secondary | ICD-10-CM | POA: Diagnosis not present

## 2024-06-25 DIAGNOSIS — F411 Generalized anxiety disorder: Secondary | ICD-10-CM | POA: Diagnosis not present

## 2024-06-25 DIAGNOSIS — F4320 Adjustment disorder, unspecified: Secondary | ICD-10-CM | POA: Diagnosis not present

## 2024-06-25 NOTE — Progress Notes (Signed)
 Established Patient Office Visit  Subjective   Patient ID: Rebekah [name], female    DOB: 05/21/1986  Age: 38 y.o. MRN: [id number]  Chief Complaint  Patient presents with   Medical Management of Chronic Issues    HPI Rebekah [name] is here for follow up of obesity after starting Wegovy . She is currently on maintenance dose of 2.4 mg weekly. She has gained 1 lbs since her last visit 6 weeks ago. She has had a net weight loss of 19 lbs so far. Her daughter started kindergarten this month and has struggled with the transition. This has been difficulty for Rebekah [name] and she has slipped on her diet because of it. She is disappointed by her weight today and will get back on track with her diet and exercise.   Starting weight: 205 lb Starting BMI: 36.31   Continues to follow up with [facility] for anxiety and depression. Denies SI.      06/25/2024   10:33 AM 05/12/2024    9:12 AM 01/10/2024    3:08 PM  Depression screen PHQ 2/9  Decreased Interest 2 1 2   Down, Depressed, Hopeless 1 1 1   PHQ - 2 Score 3 2 3   Altered sleeping 3 3 3   Tired, decreased energy 3 2 2   Change in appetite 2 1 3   Feeling bad or failure about yourself  1 0 0  Trouble concentrating 2 1 1   Moving slowly or fidgety/restless 0 0 0  Suicidal thoughts 0 0 0  PHQ-9 Score 14 9 12   Difficult doing work/chores Somewhat difficult Somewhat difficult Somewhat difficult      06/25/2024   10:33 AM 05/12/2024    9:12 AM 01/10/2024    3:10 PM 10/11/2023    2:52 PM  GAD 7 : Generalized Anxiety Score  Nervous, Anxious, on Edge 3 3 3 2   Control/stop worrying 2 3 3 2   Worry too much - different things 2 3 3 1   Trouble relaxing 3 1 2 1   Restless 1 0 1 0  Easily annoyed or irritable 2 2 1 2   Afraid - awful might happen 2 0 2 1  Total GAD 7 Score 15 12 15 9   Anxiety Difficulty Somewhat difficult Somewhat difficult Somewhat difficult Somewhat difficult     ROS As per HPI.    Objective:     BP 113/76   Pulse (!) 103   Temp 98 F (36.7 C)  (Temporal)   Ht 5' 3 (1.6 m)   Wt 186 lb 9.6 oz (84.6 kg)   SpO2 97%   BMI 33.05 kg/m  Wt Readings from Last 3 Encounters:  06/25/24 186 lb 9.6 oz (84.6 kg)  05/12/24 185 lb 3.2 oz (84 kg)  01/10/24 192 lb 12.8 oz (87.5 kg)     Physical Exam Vitals and nursing note reviewed.  Constitutional:      General: She is not in acute distress.    Appearance: She is not ill-appearing, toxic-appearing or diaphoretic.  Cardiovascular:     Rate and Rhythm: Normal rate and regular rhythm.     Heart sounds: Normal heart sounds. No murmur heard. Pulmonary:     Effort: Pulmonary effort is normal. No respiratory distress.     Breath sounds: Normal breath sounds. No wheezing.  Musculoskeletal:     Right lower leg: No edema.     Left lower leg: No edema.  Skin:    General: Skin is warm and dry.  Neurological:     General: No  focal deficit present.     Mental Status: She is alert and oriented to person, place, and time.  Psychiatric:        Mood and Affect: Mood normal.        Behavior: Behavior normal.    No results found for any visits on 06/25/24.    The ASCVD Risk score (Arnett DK, et al., 2019) failed to calculate for the following reasons:   The 2019 ASCVD risk score is only valid for ages 21 to 9    Assessment & Plan:   Sharalee was seen today for medical management of chronic issues.  Diagnoses and all orders for this visit:  Morbid obesity (HCC) Patient's BMI is >30 mg/m2.  Patient's current BMI is Body mass index is 33.05 kg/m.SABRA  Patient has lost and maintained a 5% body weight loss.  Patient has gain 1 lbs since last visit. She will restart a well balanced diet and exercise. Net weight loss of 19 lbs. Patient does not have a personal or family history of medullary thyroid  carcinoma (MTC) or Multiple Endocrine Neoplasia syndrome type 2 (MEN 2).  Generalized anxiety disorder with panic attacks Depression, recurrent (HCC) Uncontrolled. Denies SI. Follow up with [facility].     Return in about 6 weeks (around 08/06/2024) for medication follow up.   The patient indicates understanding of these issues and agrees with the plan.  Annabella CHRISTELLA Search, FNP

## 2024-06-30 ENCOUNTER — Encounter: Payer: Self-pay | Admitting: Family Medicine

## 2024-07-04 DIAGNOSIS — Z3042 Encounter for surveillance of injectable contraceptive: Secondary | ICD-10-CM | POA: Diagnosis not present

## 2024-07-11 DIAGNOSIS — F4320 Adjustment disorder, unspecified: Secondary | ICD-10-CM | POA: Diagnosis not present

## 2024-07-11 DIAGNOSIS — F411 Generalized anxiety disorder: Secondary | ICD-10-CM | POA: Diagnosis not present

## 2024-07-15 DIAGNOSIS — F4320 Adjustment disorder, unspecified: Secondary | ICD-10-CM | POA: Diagnosis not present

## 2024-07-15 DIAGNOSIS — Z419 Encounter for procedure for purposes other than remedying health state, unspecified: Secondary | ICD-10-CM | POA: Diagnosis not present

## 2024-07-15 DIAGNOSIS — F411 Generalized anxiety disorder: Secondary | ICD-10-CM | POA: Diagnosis not present

## 2024-07-16 DIAGNOSIS — R12 Heartburn: Secondary | ICD-10-CM | POA: Diagnosis not present

## 2024-07-23 DIAGNOSIS — F419 Anxiety disorder, unspecified: Secondary | ICD-10-CM | POA: Diagnosis not present

## 2024-07-28 ENCOUNTER — Other Ambulatory Visit: Payer: Self-pay | Admitting: Family Medicine

## 2024-07-28 NOTE — Telephone Encounter (Unsigned)
 Copied from CRM #[id number]. Topic: Clinical - Medication Refill >> Jul 28, 2024  3:19 PM Carlatta H wrote: Medication: ondansetron  (ZOFRAN -ODT) 4 MG disintegrating tablet  Has the patient contacted their pharmacy? Yes (Agent: If no, request that the patient contact the pharmacy for the refill. If patient does not wish to contact the pharmacy document the reason why and proceed with request.) (Agent: If yes, when and what did the pharmacy advise?)  This is the patient's preferred pharmacy:  [facility] - [place], [place] - [address] [place] [address] Phone: [phone] Fax: [phone]  Is this the correct pharmacy for this prescription? Yes If no, delete pharmacy and type the correct one.   Has the prescription been filled recently? No  Is the patient out of the medication? Yes  Has the patient been seen for an appointment in the last year OR does the patient have an upcoming appointment? Yes  Can we respond through MyChart? Yes  Agent: Please be advised that Rx refills may take up to 3 business days. We ask that you follow-up with your pharmacy.

## 2024-07-31 MED ORDER — ONDANSETRON 4 MG PO TBDP: ORAL_TABLET | ORAL | 0 refills | Status: DC

## 2024-07-31 NOTE — Telephone Encounter (Signed)
 Pt called to check status of her refill request, she requested this on Monday. Please advise and contact the patient once this is submitted.  Best contact: [phone]

## 2024-07-31 NOTE — Telephone Encounter (Signed)
 Last OV 06/25/24. Last RF 06/04/24. Next OV 08/07/24

## 2024-08-07 ENCOUNTER — Ambulatory Visit: Admitting: Family Medicine

## 2024-08-15 DIAGNOSIS — Z419 Encounter for procedure for purposes other than remedying health state, unspecified: Secondary | ICD-10-CM | POA: Diagnosis not present

## 2024-08-19 DIAGNOSIS — R12 Heartburn: Secondary | ICD-10-CM | POA: Diagnosis not present

## 2024-08-19 DIAGNOSIS — Z760 Encounter for issue of repeat prescription: Secondary | ICD-10-CM | POA: Diagnosis not present

## 2024-08-20 DIAGNOSIS — F411 Generalized anxiety disorder: Secondary | ICD-10-CM | POA: Diagnosis not present

## 2024-08-25 DIAGNOSIS — F419 Anxiety disorder, unspecified: Secondary | ICD-10-CM | POA: Diagnosis not present

## 2024-09-08 DIAGNOSIS — F419 Anxiety disorder, unspecified: Secondary | ICD-10-CM | POA: Diagnosis not present

## 2024-09-15 ENCOUNTER — Ambulatory Visit: Admitting: Family Medicine

## 2024-09-15 VITALS — BP 116/81 | HR 97 | Temp 98.2°F | Ht 63.0 in | Wt 189.4 lb

## 2024-09-15 DIAGNOSIS — K029 Dental caries, unspecified: Secondary | ICD-10-CM | POA: Diagnosis not present

## 2024-09-15 DIAGNOSIS — E6609 Other obesity due to excess calories: Secondary | ICD-10-CM

## 2024-09-15 DIAGNOSIS — F411 Generalized anxiety disorder: Secondary | ICD-10-CM

## 2024-09-15 DIAGNOSIS — E66811 Obesity, class 1: Secondary | ICD-10-CM | POA: Diagnosis not present

## 2024-09-15 DIAGNOSIS — F410 Panic disorder [episodic paroxysmal anxiety] without agoraphobia: Secondary | ICD-10-CM

## 2024-09-15 DIAGNOSIS — F339 Major depressive disorder, recurrent, unspecified: Secondary | ICD-10-CM

## 2024-09-15 DIAGNOSIS — Z6833 Body mass index (BMI) 33.0-33.9, adult: Secondary | ICD-10-CM

## 2024-09-15 MED ORDER — ONDANSETRON 4 MG PO TBDP: ORAL_TABLET | ORAL | 0 refills | Status: DC

## 2024-09-15 MED ORDER — AMOXICILLIN 500 MG PO CAPS: 500.0000 mg | ORAL_CAPSULE | Freq: Three times a day (TID) | ORAL | 0 refills | Status: AC

## 2024-09-15 NOTE — Progress Notes (Unsigned)
   Acute Office Visit  Subjective:     Patient ID: Rebekah [name], female    DOB: 05-20-1986, 38 y.o.   MRN: [id number]  Chief Complaint  Patient presents with   Obesity    HPI Patient is in today for ***  ROS      Objective:    BP 116/81   Pulse 97   Temp 98.2 F (36.8 C) (Temporal)   Ht 5' 3 (1.6 m)   Wt 189 lb 6.4 oz (85.9 kg)   SpO2 96%   BMI 33.55 kg/m  Wt Readings from Last 3 Encounters:  09/15/24 189 lb 6.4 oz (85.9 kg)  06/25/24 186 lb 9.6 oz (84.6 kg)  05/12/24 185 lb 3.2 oz (84 kg)      Physical Exam  No results found for any visits on 09/15/24.      Assessment & Plan:   Problem List Items Addressed This Visit   None   No orders of the defined types were placed in this encounter.   No follow-ups on file.  Annabella CHRISTELLA Search, FNP

## 2024-09-17 ENCOUNTER — Encounter: Payer: Self-pay | Admitting: Family Medicine

## 2024-09-17 DIAGNOSIS — Z3042 Encounter for surveillance of injectable contraceptive: Secondary | ICD-10-CM | POA: Diagnosis not present

## 2024-09-17 DIAGNOSIS — F419 Anxiety disorder, unspecified: Secondary | ICD-10-CM | POA: Diagnosis not present

## 2024-09-17 DIAGNOSIS — F4320 Adjustment disorder, unspecified: Secondary | ICD-10-CM | POA: Diagnosis not present

## 2024-10-06 ENCOUNTER — Encounter: Payer: Self-pay | Admitting: Family Medicine

## 2024-10-06 ENCOUNTER — Ambulatory Visit: Payer: Self-pay | Admitting: Family Medicine

## 2024-10-06 VITALS — BP 123/78 | HR 98 | Temp 98.5°F | Ht 63.0 in | Wt 190.6 lb

## 2024-10-06 DIAGNOSIS — D1723 Benign lipomatous neoplasm of skin and subcutaneous tissue of right leg: Secondary | ICD-10-CM | POA: Diagnosis not present

## 2024-10-06 DIAGNOSIS — K219 Gastro-esophageal reflux disease without esophagitis: Secondary | ICD-10-CM | POA: Diagnosis not present

## 2024-10-06 DIAGNOSIS — F411 Generalized anxiety disorder: Secondary | ICD-10-CM

## 2024-10-06 DIAGNOSIS — R110 Nausea: Secondary | ICD-10-CM | POA: Diagnosis not present

## 2024-10-06 DIAGNOSIS — F410 Panic disorder [episodic paroxysmal anxiety] without agoraphobia: Secondary | ICD-10-CM

## 2024-10-06 MED ORDER — ONDANSETRON 4 MG PO TBDP: ORAL_TABLET | ORAL | 0 refills | Status: DC

## 2024-10-06 MED ORDER — OMEPRAZOLE 20 MG PO CPDR: 20.0000 mg | DELAYED_RELEASE_CAPSULE | Freq: Every day | ORAL | 3 refills | Status: AC

## 2024-10-06 NOTE — Progress Notes (Signed)
 Acute Office Visit  Subjective:     Patient ID: Rebekah [name], female    DOB: 1986/07/26, 38 y.o.   MRN: [id number]  Chief Complaint  Patient presents with   Cyst    HPI  History of Present Illness   Rebekah [name] is a 37 year old female who presents for a refill of omeprazole  and Zofran , and evaluation of a leg mass.  Gastroesophageal reflux disease (gerd) - GERD is well-controlled with omeprazole . - No heartburn present. - No dysphagia  Nausea and diarrhea - Nausea attributed to stress, particularly related to upcoming dental surgery. - Diarrhea described as 'like water' for several days, believed to be stress-related. - Eating normally despite symptoms. - No longer taking Wegovy . - Requesting refill of zofran   Lower extremity mass - Small, movable, soft lump present on leg for six to seven years. - Mass has moved slightly over time. - Manipulates the mass due to anxiety about upcoming surgery. - No pain, tenderness, warmth, erythema to mass  Preoperative anxiety - Significant anxiety regarding upcoming dental surgery scheduled for Wednesday. - Expresses concern about being put to sleep for the procedure. - Concerned about hydration and plans to drink water the night before surgery to ensure veins are suitable for IV access.       ROS As per HPI.      Objective:    BP 123/78   Pulse 98   Temp 98.5 F (36.9 C) (Temporal)   Ht 5' 3 (1.6 m)   Wt 190 lb 9.6 oz (86.5 kg)   SpO2 96%   BMI 33.76 kg/m    Physical Exam Vitals and nursing note reviewed.  Constitutional:      General: She is not in acute distress.    Appearance: Normal appearance. She is not ill-appearing, toxic-appearing or diaphoretic.  Cardiovascular:     Heart sounds: Normal heart sounds.  Pulmonary:     Effort: Pulmonary effort is normal.  Musculoskeletal:     Right lower leg: No edema.     Left lower leg: No edema.  Lymphadenopathy:     Cervical: No cervical adenopathy.   Skin:    General: Skin is warm and dry.     Comments: 1 cm soft, mobile lipoma to right lateral thigh.   Neurological:     General: No focal deficit present.     Mental Status: She is alert and oriented to person, place, and time.  Psychiatric:        Attention and Perception: Attention normal.        Mood and Affect: Mood is anxious.        Speech: Speech normal.        Behavior: Behavior normal.     No results found for any visits on 10/06/24.      Assessment & Plan:   Rebekah [name] was seen today for cyst.  Diagnoses and all orders for this visit:  Gastroesophageal reflux disease without esophagitis -     omeprazole  (PRILOSEC) 20 MG capsule; Take 1 capsule (20 mg total) by mouth daily.  Nausea -     ondansetron  (ZOFRAN -ODT) 4 MG disintegrating tablet; TAKE 1 TABLET BY MOUTH EVERY 8 HOURS AS NEEDED FOR NAUSEA AND VOMITING  Lipoma of right thigh  Generalized anxiety disorder with panic attacks   Assessment and Plan    Gastroesophageal reflux disease Well-controlled with omeprazole . - Refilled omeprazole  prescription.  Nausea Intermittent, possibly stress-related.  - Refilled Zofran  (ondansetron ) dissolvable tablets.  Benign  lipoma of thigh Stable benign lipoma, soft and mobile, no significant changes. - Monitor for changes in size, redness, or soreness. - Reassess if significant movement or symptoms occur.   Anxiety Uncontrolled currently. Continue follow up with BH.       Return if symptoms worsen or fail to improve.  The patient indicates understanding of these issues and agrees with the plan.   Rebekah CHRISTELLA Search, FNP

## 2024-10-08 DIAGNOSIS — F4320 Adjustment disorder, unspecified: Secondary | ICD-10-CM | POA: Diagnosis not present

## 2024-10-08 DIAGNOSIS — F419 Anxiety disorder, unspecified: Secondary | ICD-10-CM | POA: Diagnosis not present

## 2024-10-15 DIAGNOSIS — F411 Generalized anxiety disorder: Secondary | ICD-10-CM | POA: Diagnosis not present

## 2024-11-03 DIAGNOSIS — F411 Generalized anxiety disorder: Secondary | ICD-10-CM | POA: Diagnosis not present

## 2024-11-12 DIAGNOSIS — F411 Generalized anxiety disorder: Secondary | ICD-10-CM | POA: Diagnosis not present

## 2024-11-13 ENCOUNTER — Other Ambulatory Visit: Payer: Self-pay | Admitting: Family Medicine

## 2024-11-13 DIAGNOSIS — R110 Nausea: Secondary | ICD-10-CM

## 2024-11-14 DIAGNOSIS — Z419 Encounter for procedure for purposes other than remedying health state, unspecified: Secondary | ICD-10-CM | POA: Diagnosis not present

## 2024-11-14 DIAGNOSIS — F411 Generalized anxiety disorder: Secondary | ICD-10-CM | POA: Diagnosis not present

## 2024-12-02 DIAGNOSIS — F411 Generalized anxiety disorder: Secondary | ICD-10-CM | POA: Diagnosis not present

## 2024-12-10 ENCOUNTER — Other Ambulatory Visit: Payer: Self-pay | Admitting: Family Medicine

## 2024-12-10 DIAGNOSIS — R110 Nausea: Secondary | ICD-10-CM

## 2024-12-10 NOTE — Telephone Encounter (Signed)
 Copied from CRM [id number]. Topic: Clinical - Medication Refill >> Dec 10, 2024  4:21 PM Sophia H wrote: Medication: ondansetron  (ZOFRAN -ODT) 4 MG disintegrating tablet   Has the patient contacted their pharmacy? Yes, sent request with no response.   This is the patient's preferred pharmacy:  [facility] - [place], [place] - [address] [place] [address] Phone: [phone] Fax: [phone]  Is this the correct pharmacy for this prescription? Yes If no, delete pharmacy and type the correct one.   Has the prescription been filled recently? Yes  Is the patient out of the medication? Yes  Has the patient been seen for an appointment in the last year OR does the patient have an upcoming appointment? Yes, seen back in November.   Can we respond through MyChart? Yes  Agent: Please be advised that Rx refills may take up to 3 business days. We ask that you follow-up with your pharmacy.

## 2025-01-09 ENCOUNTER — Telehealth: Payer: Self-pay | Admitting: Family Medicine

## 2025-01-09 ENCOUNTER — Telehealth: Admitting: Family Medicine

## 2025-01-09 ENCOUNTER — Encounter: Payer: Self-pay | Admitting: Family Medicine

## 2025-01-09 DIAGNOSIS — L02818 Cutaneous abscess of other sites: Secondary | ICD-10-CM

## 2025-01-09 MED ORDER — SULFAMETHOXAZOLE-TRIMETHOPRIM 800-160 MG PO TABS: 1.0000 | ORAL_TABLET | Freq: Two times a day (BID) | ORAL | 0 refills | Status: DC

## 2025-01-09 MED ORDER — SULFAMETHOXAZOLE-TRIMETHOPRIM 800-160 MG PO TABS: 1.0000 | ORAL_TABLET | Freq: Two times a day (BID) | ORAL | 0 refills | Status: AC

## 2025-01-09 NOTE — Telephone Encounter (Signed)
Left detailed message it was sent in

## 2025-01-09 NOTE — Telephone Encounter (Signed)
 Copied from CRM [id number]. Topic: Clinical - Medication Question >> Jan 09, 2025 10:32 AM Delon T wrote: Reason for CRM: Bactrim  has not been called in yet, went to pick up and it is not there.

## 2025-01-09 NOTE — Progress Notes (Signed)
" ° ° °                   MyChart Video Visit    Virtual Visit via Video Note   This format is felt to be most appropriate for this patient at this time. Physical exam was limited by quality of the video and audio technology used for the visit.    Patient location: home Provider location: [facility] Persons involved in the visit: patient, provider  I discussed the limitations of evaluation and management by telemedicine and the availability of in person appointments. The patient expressed understanding and agreed to proceed.  Patient: Rebekah [name]   DOB: [birth year]   39 y.o. Female  MRN: [id number] Visit Date: 01/09/2025  Today's healthcare provider: Annabella CHRISTELLA Search, FNP   Chief Complaint  Patient presents with   Cyst    Subjective:    HPI  History of Present Illness   Rebekah [name] is a 39 year old female who presents with ingrown hairs in the groin area.  Cutaneous lesions - Two ingrown hairs in the groin area, present for approximately two days - One lesion is the size of an olive, located under the skin, not erythematous - Area is tender to palpation - Manipulation of the larger lesion resulted in pus drainage  Constitutional symptoms - No fever - No other systemic symptoms  Medication allergies - No allergies to antibiotics       ROS As per HPI.       Objective:    There were no vitals taken for this visit.      Physical Exam Vitals and nursing note reviewed.  Constitutional:      General: She is not in acute distress.    Appearance: She is not ill-appearing, toxic-appearing or diaphoretic.  Pulmonary:     Effort: Pulmonary effort is normal. No respiratory distress.  Neurological:     Mental Status: She is alert and oriented to person, place, and time. Mental status is at baseline.  Psychiatric:        Mood and Affect: Mood normal.        Behavior: Behavior normal.         Assessment & Plan:     [name] was seen today for cyst.  Diagnoses and all orders for this visit:  Cutaneous abscess of other site   Assessment and Plan    Cutaneous abscess of groin Two ingrown hairs in the groin, one larger and draining pus, both tender. - Prescribed Bactrim  BID for 7 days. - Advised warm compresses 20 minutes TID.       Return if symptoms worsen or fail to improve.     I discussed the assessment and treatment plan with the patient. The patient was provided an opportunity to ask questions and all were answered. The patient agreed with the plan and demonstrated an understanding of the instructions.   The patient was advised to call back or seek an in-person evaluation if the symptoms worsen or if the condition fails to improve as anticipated.  I provided 8 minutes of non-face-to-face time during this encounter.  Annabella CHRISTELLA Search, FNP [facility]    "

## 2025-01-09 NOTE — Addendum Note (Signed)
 Addended by: [name] on: 01/09/2025 11:02 AM   Modules accepted: Orders

## 2025-01-09 NOTE — Addendum Note (Signed)
 Addended by: [name] on: 01/09/2025 11:01 AM   Modules accepted: Orders

## 2025-01-12 ENCOUNTER — Ambulatory Visit: Admitting: Family Medicine
